# Patient Record
Sex: Male | Born: 1985 | Race: White | Hispanic: No | Marital: Single | Smoking: Current every day smoker
Health system: Southern US, Community
[De-identification: ages and names within clinical notes are randomized; demographics above are authoritative.]

## PROBLEM LIST (undated history)

## (undated) DIAGNOSIS — K219 Gastro-esophageal reflux disease without esophagitis: Secondary | ICD-10-CM

## (undated) DIAGNOSIS — N201 Calculus of ureter: Secondary | ICD-10-CM

## (undated) DIAGNOSIS — R3915 Urgency of urination: Secondary | ICD-10-CM

## (undated) DIAGNOSIS — Z87442 Personal history of urinary calculi: Secondary | ICD-10-CM

## (undated) DIAGNOSIS — D759 Disease of blood and blood-forming organs, unspecified: Secondary | ICD-10-CM

## (undated) DIAGNOSIS — D693 Immune thrombocytopenic purpura: Secondary | ICD-10-CM

## (undated) DIAGNOSIS — K589 Irritable bowel syndrome without diarrhea: Secondary | ICD-10-CM

---
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
## 2007-04-10 ENCOUNTER — Encounter: Admission: RE | Admit: 2007-04-10 | Discharge: 2007-04-10 | Payer: Self-pay | Admitting: Family Medicine

## 2007-05-31 ENCOUNTER — Ambulatory Visit: Payer: Self-pay | Admitting: Gastroenterology

## 2007-05-31 LAB — CONVERTED CEMR LAB
ALT: 29 units/L (ref 0–53)
AST: 19 units/L (ref 0–37)
Albumin: 4 g/dL (ref 3.5–5.2)
Alkaline Phosphatase: 76 units/L (ref 39–117)
Basophils Absolute: 0.1 10*3/uL (ref 0.0–0.1)
Eosinophils Absolute: 0.1 10*3/uL (ref 0.0–0.6)
GFR calc Af Amer: 111 mL/min
Glucose, Bld: 92 mg/dL (ref 70–99)
HCT: 48.7 % (ref 39.0–52.0)
Hemoglobin, Urine: NEGATIVE
Ketones, ur: NEGATIVE mg/dL
MCHC: 33.4 g/dL (ref 30.0–36.0)
MCV: 84.2 fL (ref 78.0–100.0)
Nitrite: NEGATIVE
Platelets: 133 10*3/uL — ABNORMAL LOW (ref 150–400)
Potassium: 4.4 meq/L (ref 3.5–5.1)
RBC: 5.78 M/uL (ref 4.22–5.81)
RDW: 11.8 % (ref 11.5–14.6)
Sodium: 140 meq/L (ref 135–145)
Total Bilirubin: 0.8 mg/dL (ref 0.3–1.2)
Total Protein: 6.8 g/dL (ref 6.0–8.3)
Urine Glucose: NEGATIVE mg/dL
Urobilinogen, UA: 0.2 (ref 0.0–1.0)
WBC: 7.5 10*3/uL (ref 4.5–10.5)
pH: 6.5 (ref 5.0–8.0)

## 2007-06-03 ENCOUNTER — Ambulatory Visit: Payer: Self-pay | Admitting: Internal Medicine

## 2007-06-05 ENCOUNTER — Ambulatory Visit (HOSPITAL_COMMUNITY): Admission: RE | Admit: 2007-06-05 | Discharge: 2007-06-05 | Payer: Self-pay | Admitting: Gastroenterology

## 2007-06-05 ENCOUNTER — Ambulatory Visit: Payer: Self-pay | Admitting: Gastroenterology

## 2007-06-10 ENCOUNTER — Ambulatory Visit: Payer: Self-pay | Admitting: Gastroenterology

## 2009-03-22 ENCOUNTER — Encounter: Admission: RE | Admit: 2009-03-22 | Discharge: 2009-03-22 | Payer: Self-pay | Admitting: Family Medicine

## 2010-09-13 NOTE — Assessment & Plan Note (Signed)
[facility]                         GASTROENTEROLOGY OFFICE NOTE   NAME:[name], Blake Powell                    MRN:          [id number]  DATE:06/05/2007                            DOB:          April 08, 1986    PROBLEM:  Abdominal pain.   [name] has returned for re-evaluation.  He continues to complain  of migratory abdominal pain.  It is rather diffuse, but mostly described  as burning in his midepigastrium.  He has had postprandial vomiting.  Symptoms are not improved since starting his PrevPak.  He has had some  loose stools.   A CT of the abdomen and pelvis was unremarkable.  Laboratory was also  unremarkable.  He is without fevers or myalgias.   ON EXAM:  Blood pressure 118/96, weight 230.  On abdominal exam, he has minimal tenderness in the right upper  quadrant, without guarding or rebound.  There are no abdominal masses or  organomegaly.   IMPRESSION:  Persistent burning abdominal pain, now centered in the  upper abdomen, with postprandial vomiting.  Symptoms could be due to  active peptic disease.  It is unlikely that he has appendicitis.  There  is no evidence by CT of any other acute process in the abdomen or  pelvis.  A prolonged gastritis or gastroenteritis is less likely.   RECOMMENDATIONS:  1. Continue Prevacid.  2. Upper endoscopy.     Barbette Hair. Arlyce Dice, MD,FACG  Electronically Signed    RDK/MedQ  DD: 06/05/2007  DT: 06/05/2007  Job #: [id number]

## 2010-09-16 NOTE — Consult Note (Signed)
NAME:  Blake Powell, Blake Powell NO.:  [id number]   MEDICAL RECORD NO.:  [id number]          PATIENT TYPE:  EMS   LOCATION:  [facility]                         FACILITY:  [facility]   PHYSICIAN:  Michael L. Reynolds, M.D.DATE OF BIRTH:  01/28/1986   DATE OF CONSULTATION:  03/12/2005  DATE OF DISCHARGE:  03/12/2005                                   CONSULTATION   REQUESTING PHYSICIAN:  [facility]   CHIEF COMPLAINT:  Headache and right-sided weakness.   HISTORY OF PRESENT ILLNESS:  This is the initial emergency room consultation  evaluation of this 25 year old man, past medical history which includes  irritable bowel syndrome.  Patient reports that he went to bed this  yesterday evening feeling well.  He woke about 12:30 or 1:00 this morning  with a severe headache.  He describes this as severe in character in the  middle of his head with throbbing component associated with some  photophobia, but not really with nausea or phonophobia.  He was brought to  the emergency room for further evaluation of this.  He says that he had a  brief episode while he was in the emergency room of not being able to see  out of his right eye which quickly resolved.  He also has been noted to have  some right-sided weakness in the emergency room as well.  His headache has  been fluctuating somewhat.  He denies having any associated numbness which  he is aware, although his right side feels as if it is tired.  He does, on  further questioning, have a history of headaches dating back to 52 or 25  years of age which he says were fairly severe, although not enough to  interfere with his schooling, often lasting all day which he would sometimes  take medications.  He has had laboratory work and CT in the emergency room  which are available for my review.  According to the ER physician this  evening he would hit his head due to the pain.   PAST MEDICAL HISTORY:  Remarkable for headaches as above  and irritable bowel  syndrome as above.  He denies any other medical problems.   FAMILY HISTORY:  Remarkable for migraine in his mother.   SOCIAL HISTORY:  No known alcohol or drug use.   MEDICATIONS:  None.   REVIEW OF SYSTEMS:  Remarkable for the right-sided weakness, photophobia as  above.  He has not had any fever, chills, nausea, vomiting, chest pain,  shortness of breath.  Full 10-system review is negative except as otherwise  outlined in the HPI and in the emergency room records.   PHYSICAL EXAMINATION:  VITAL SIGNS:  Temperature 97.2, blood pressure  118/70, pulse 74, respirations 24.  GENERAL:  This is a healthy-appearing man supine in the hospital bed in no  evident distress, a little uncomfortable.  HEENT:  Head:  Cranium is normocephalic, atraumatic.  Oropharynx is benign.  NECK:  Supple without carotid or supraclavicular bruits.  HEART:  Regular rate and rhythm without murmurs.  NEUROLOGIC:  Mental status:  He is awake, alert.  He is oriented to person,  place, and time.  Recent and remote memory are adequate.  He is able to name  objects and repeat phrases.  His speech is fluent and not dysarthric.  Cranial nerves:  Funduscopic examination is benign.  Pupils are equal and  briskly reactive.  Extraocular movements full without nystagmus.  Visual  fields full to confrontation.  He reports not seeing well off to the right  side, but has a definite threat to visual stimuli from the right side.  Hearing is intact to conversational speech.  He reports diminished pin prick  sensation on the right side.  He also reports diminished vibratory sensation  on the right side of the forehead greater than left with forehead splitting.  Face, tongue, and palate move normally and symmetrically.  Shoulder shrug  strength is normal.  Motor testing:  Normal bulk and tone.  He has giveaway  weakness on the right upper and lower extremities.  Left is normal strength.  Sensation:  He reports  diminished pin prick sensation over the entire right  upper and lower extremities as well as the adjacent trunk.  Coordination:  Rapid movements are performed well.  He misses his nose on finger to nose  testing on the right with his eyes closed, but does well with cerebellar  tasks including finger to nose and heel to shin with his eyes open.  Gait:  He arises from a chair easily.  Stance is normal.  He is able to heel and  toe walk without much difficulty, performs Romberg maneuver without falling.  Reflexes 2+ and symmetric.  Toes are downgoing.  He had idiopathic  thrombocytopenic purpura at age 40 and continues to be checked on occasion  for that.   LABORATORIES:  CBC:  White count 6, hemoglobin 14.3, platelets 120,000.  Coags are normal.  Urinalysis is negative.  Urine drug screen is pending.  Alcohol level is negative.  Chemistries are unremarkable.  CT of the head is  personally reviewed and the study is normal.   IMPRESSION:  1.  Headache.  With a previous history of the photophobia and the nighttime      onset as well as the positive family history this most likely represents      migraine.  2.  Right-sided focal neurologic symptoms.  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   RECOMMENDATIONS:  Will check an MRI of the brain with MRA of the  intracranial circulation just to demonstrate the absence of an acute  infarct, MS plaque, or other acute process.  If it is negative as expected,  I would recommend empiric treatment for migraine.  Thank you for this  consultation.      Michael L. Thad Ranger, M.D.  Electronically Signed     MLR/MEDQ  D:  03/12/2005  T:  03/13/2005  Job:  [id number]

## 2010-09-16 NOTE — Assessment & Plan Note (Signed)
[facility]                         GASTROENTEROLOGY OFFICE NOTE   NAME:Blake Powell, Blake Powell                    MRN:          [id number]  DATE:05/31/2007                            DOB:          24-Oct-1985    PROBLEM:  Abdominal pain x3 days with nausea, vomiting, and small volume  hematemesis.   HISTORY OF PRESENT ILLNESS:  Blake Powell is a pleasant generally healthy 25-  year-old white male with no known chronic medical problems and no  previous surgeries.  He has had problems with headaches.  The patient  says that his symptoms started about three days ago with epigastric pain  which has been fairly constant since, burning in nature, but has now  moved down into his low back and right lower quadrant and into the right  leg.  He has not been aware of any injury.  This was associated  initially with nausea and vomiting several times the first day that he  was ill.  He says he vomited up some streaks of blood with his emesis  but no gross blood or coffee ground material.  He has not had any fever  or chills.  No diarrhea, melena or hematochezia.  He was seen by [place]  [facility].  Was tested for H. pylori.  This was  positive.  Also had a CBC done showing WBC 7.6, hemoglobin 15.6,  hematocrit 45.1, MCV 80, platelets 136.  He was placed on a Prevpac.  He  says he does not feel quite as bad, but continues to have pain.  Has not  vomited today.  He denies any dysuria or hematuria, but says that he has  been urinating a lot.   CURRENT MEDICATIONS:  Prevpac.   ALLERGIES:  SULFA.   PAST MEDICAL HISTORY:  Benign with the exception of unspecified  headaches.  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   FAMILY HISTORY:  Pertinent for diabetes on the father's side of the  family, Crohn's disease on the mother's side of the family, but not in  his immediate family.   SOCIAL HISTORY:  The patient is single.  He lives with his parents.  He  has GED.  He  is employed with [employer].  He is a smoker one pack  per day.  Denies any regular ETOH.   REVIEW OF SYSTEMS:  GI:  As outlined above.  He also complains of  fatigue.  Review of Systems is, otherwise, negative.   PHYSICAL EXAMINATION:  GENERAL:  A well-developed young white male in no  acute distress.  He is pleasant.  VITAL SIGNS:  Height 6 feet 4 inches, weight 231, blood pressure 129/72,  pulse in the 60's.  HEENT:  Atraumatic, normocephalic.  EOMI, PERRLA.  Sclerae anicteric.  NECK:  Supple without nodes.  CARDIOVASCULAR:  Regular rate and rhythm with S1 and S2.  No murmur, rub  or gallop.  PULMONARY:  Clear to A&P.  ABDOMEN:  Soft.  He is tender in the epigastrium and in the right mid  quadrant.  There is no guarding or rebound.  No mass or  hepatosplenomegaly.  Bowel sounds are active.  RECTAL:  Not done today.   IMPRESSION:  1. A 25 year old white male with three day history of epigastric pain,      nausea, vomiting, and minimal streaky hematemesis now with pain      somewhat migratory to lower abdomen.  Etiology is not entirely      clear.  Rule out gastritis, peptic ulcer disease, gastroenteritis,      ureterolithiasis, doubt appendicitis as he clinically does not      appear ill or toxic.  2. Small volume hematemesis probably related to retching.  3. Chronic gastroesophageal reflux disease.   PLAN:  1. Check CT scan of the abdomen and pelvis.  2. Repeat CBC with differential.  Check CMET and UA.  3. Advise patient to continue the Prevpac for the time being, though      seriously doubt that H. pylori was responsible for his illness the      PPI will help him.  4. Tylenol on a p.r.n. basis.  He had been taking some ibuprofen, and      was advised to switch to Tylenol as needed.  We will see him back      in the office in a week or two or sooner pending results of his      labs and CT.  He may need endoscopy, but will await CT.      Mike Gip, PA-C   Electronically Signed      Barbette Hair. Arlyce Dice, MD,FACG  Electronically Signed   AE/MedQ  DD: 05/31/2007  DT: 06/01/2007  Job #: [id number]   cc:   Ernestina Penna, M.D.  [facility]

## 2011-08-02 ENCOUNTER — Emergency Department (HOSPITAL_BASED_OUTPATIENT_CLINIC_OR_DEPARTMENT_OTHER)
Admission: EM | Admit: 2011-08-02 | Discharge: 2011-08-02 | Disposition: A | Discharge status: Home / Self Care | Payer: Worker's Compensation | Attending: Emergency Medicine | Admitting: Emergency Medicine

## 2011-08-02 ENCOUNTER — Emergency Department (INDEPENDENT_AMBULATORY_CARE_PROVIDER_SITE_OTHER): Payer: Worker's Compensation

## 2011-08-02 ENCOUNTER — Encounter (HOSPITAL_BASED_OUTPATIENT_CLINIC_OR_DEPARTMENT_OTHER): Payer: Self-pay | Admitting: *Deleted

## 2011-08-02 DIAGNOSIS — Y9289 Other specified places as the place of occurrence of the external cause: Secondary | ICD-10-CM | POA: Insufficient documentation

## 2011-08-02 DIAGNOSIS — W312XXA Contact with powered woodworking and forming machines, initial encounter: Secondary | ICD-10-CM

## 2011-08-02 DIAGNOSIS — W298XXA Contact with other powered powered hand tools and household machinery, initial encounter: Secondary | ICD-10-CM | POA: Insufficient documentation

## 2011-08-02 DIAGNOSIS — S6010XA Contusion of unspecified finger with damage to nail, initial encounter: Secondary | ICD-10-CM

## 2011-08-02 DIAGNOSIS — S62639B Displaced fracture of distal phalanx of unspecified finger, initial encounter for open fracture: Secondary | ICD-10-CM | POA: Insufficient documentation

## 2011-08-02 DIAGNOSIS — S61209A Unspecified open wound of unspecified finger without damage to nail, initial encounter: Secondary | ICD-10-CM

## 2011-08-02 DIAGNOSIS — S61218A Laceration without foreign body of other finger without damage to nail, initial encounter: Secondary | ICD-10-CM

## 2011-08-02 DIAGNOSIS — S6000XA Contusion of unspecified finger without damage to nail, initial encounter: Secondary | ICD-10-CM | POA: Insufficient documentation

## 2011-08-02 DIAGNOSIS — IMO0002 Reserved for concepts with insufficient information to code with codable children: Secondary | ICD-10-CM

## 2011-08-02 DIAGNOSIS — S62639A Displaced fracture of distal phalanx of unspecified finger, initial encounter for closed fracture: Secondary | ICD-10-CM

## 2011-08-02 MED ORDER — CEPHALEXIN 500 MG PO CAPS: 500.0000 mg | ORAL_CAPSULE | Freq: Three times a day (TID) | ORAL | Status: AC

## 2011-08-02 MED ORDER — LIDOCAINE HCL (PF) 1 % IJ SOLN
5.0000 mL | Freq: Once | INTRAMUSCULAR | Status: AC
  Administered 2011-08-02: 5 mL

## 2011-08-02 MED ORDER — LIDOCAINE HCL (PF) 1 % IJ SOLN
INTRAMUSCULAR | Status: AC
  Administered 2011-08-02: 5 mL
  Filled 2011-08-02: qty 5

## 2011-08-02 MED ORDER — CEPHALEXIN 250 MG PO CAPS
500.0000 mg | ORAL_CAPSULE | Freq: Once | ORAL | Status: AC
  Administered 2011-08-02: 500 mg via ORAL
  Filled 2011-08-02: qty 2

## 2011-08-02 MED ORDER — HYDROCODONE-ACETAMINOPHEN 5-325 MG PO TABS: 2.0000 | ORAL_TABLET | Freq: Four times a day (QID) | ORAL | Status: AC | PRN

## 2011-08-02 MED ORDER — HYDROCODONE-ACETAMINOPHEN 5-325 MG PO TABS
2.0000 | ORAL_TABLET | Freq: Once | ORAL | Status: AC
  Administered 2011-08-02: 2 via ORAL
  Filled 2011-08-02: qty 2

## 2011-08-02 NOTE — Discharge Instructions (Signed)
Elevate your hand. Take the antibiotic until gone. Take the Norco for pain. Call Dr. Ophelia Charter office in the morning to get an appointment to see him this week. You have a open tuft fracture with laceration of your left middle finger. Wear the finger splint for support. You can change the dressing daily and use generic triple antibiotic ointment on the wound. Recheck sooner if you get worsening pain. It starts draining pus, you see increased swelling and redness of your finger extending into your hand.

## 2011-08-02 NOTE — ED Notes (Signed)
Pt. Reports he cut his L middle finger with an electric saw at work tonight.  Pt. Reports pain 7/10.  Controlled bleeding.  Pt. Supervisor at his side states the Pt. 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

## 2011-08-02 NOTE — ED Notes (Signed)
Dr Lynelle Doctor at bedside to suture patient.

## 2011-08-02 NOTE — ED Notes (Signed)
Pt reports that he was cutting scrap wood with electric saw and incidentally cut tip of finger with saw, laceration to middle left finger, 3/4 around finger, cms to distal tip, currently soaking in betadine solution, awaiting xray

## 2011-08-02 NOTE — ED Provider Notes (Signed)
History     CSN: [id number]  Arrival date & time 08/02/11  0017   First MD Initiated Contact with Patient 08/02/11 0101      Chief Complaint  Patient presents with  . Finger Injury    Left middle finger laceration on a saw blade.  Pt. has controlled bleeding with pain in the L middle finger.  This was a work injury.    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 He relates he was cutting wood scraps and he accidentally lacerated his left middle finger on a saw blade. He denies any numbness. He denies any other injury.  PCP none  History reviewed. No pertinent past medical history.  History reviewed. No pertinent past surgical history.  No family history on file.  History  Substance Use Topics  . Smoking status: Yes  . Smokeless tobacco: Not on file  . Alcohol Use: No   employed  Last tetanus less than 5 years ago  Review of Systems  All other systems reviewed and are negative.    Allergies  Sulfa antibiotics  Home Medications  none  BP 128/89  Pulse 77  Temp(Src) 98.3 F (36.8 C) (Oral)  Resp 18  Ht 6\' 2"  (1.88 m)  Wt 260 lb (117.935 kg)  BMI 33.38 kg/m2  SpO2 100%  Vital signs normal    Physical Exam  Constitutional: He is oriented to person, place, and time. He appears well-developed and well-nourished.  Non-toxic appearance. He does not appear ill. No distress.  HENT:  Head: Normocephalic and atraumatic.  Right Ear: External ear normal.  Left Ear: External ear normal.  Nose: Nose normal. No mucosal edema or rhinorrhea.  Mouth/Throat: Mucous membranes are normal. No dental abscesses or uvula swelling.  Eyes: Conjunctivae and EOM are normal. Pupils are equal, round, and reactive to light.  Neck: Normal range of motion and full passive range of motion without pain. Neck supple.  Pulmonary/Chest: Effort normal. No  respiratory distress. He has no rhonchi. He exhibits no crepitus.  Abdominal: Normal appearance.  Musculoskeletal: Normal range of motion. He exhibits tenderness. He exhibits no edema.       Moves all extremities well.  Patient has a laceration of his distal left middle finger that starts on the radial aspect and extends distally around to the tip of the finger and then swings proximally to involve the distal fingernail. He has a small subungual hematoma. On initial appearance there is a possibility of bony injury x-rays were done.  Neurological: He is alert and oriented to person, place, and time. He has normal strength. No cranial nerve deficit.  Skin: Skin is warm, dry and intact. No rash noted. No erythema. No pallor.  Psychiatric: He has a normal mood and affect. His speech is normal and behavior is normal. His mood appears not anxious.    ED Course  Procedures (including critical care time)   Medications  lidocaine (XYLOCAINE) 1 % injection 5 mL (5 mL Infiltration Given 08/02/11 0153)  lidocaine (XYLOCAINE) 1 % injection 5 mL (5 mL Infiltration Given 08/02/11 0216)  cephALEXin (KEFLEX) capsule 500 mg (500 mg Oral Given 08/02/11 0244)  HYDROcodone-acetaminophen (NORCO) 5-325 MG per tablet 2 tablet (2 tablet Oral Given 08/02/11 0244)     LACERATION REPAIR Performed by: Devoria Albe L Authorized by: Ward Givens Consent: Verbal consent obtained. Risks and benefits: risks, benefits and alternatives were discussed Consent given by: patient Patient identity  confirmed: provided demographic data Prepped and Draped in normal sterile fashion Wound explored  Laceration Location:distal LMF Laceration Length: 4cm  No Foreign Bodies seen or palpated  Anesthesia: local infiltration after digital block  Local anesthetic: lidocaine 1% 4cc, digital block 4 cc Anesthetic total: 8 ml  Irrigation method: local cleansing with betadyne diluted in sterile saline Amount of cleaning: standard  Skin  closure: 4-0 nylon x 8 sutures, 4-0 viacryl placed across the laceration of the distal nail Number of sutures: 9  Technique: simple interrupted, one corner suture  2 trephination holes black in the prox nail b/o subungal hematoma  Patient tolerance: Patient tolerated the procedure well with no immediate complications.  Wound dressed and finger splint applied.   Labs Reviewed - No data to display Dg Finger Middle Left  08/02/2011  *RADIOLOGY REPORT*  Clinical Data: Laceration.  LEFT MIDDLE FINGER 2+V  Comparison: None  Findings: There is a distal tuft fracture along the radial margin. The joint spaces are maintained.  IMPRESSION: Slightly displaced distal tuft fracture.  Original Report Authenticated By: P. Loralie Champagne, M.D.     1. Laceration of finger, middle   2. Open fracture of distal phalangeal tuft    New Prescriptions   CEPHALEXIN (KEFLEX) 500 MG CAPSULE    Take 1 capsule (500 mg total) by mouth 3 (three) times daily.   HYDROCODONE-ACETAMINOPHEN (NORCO) 5-325 MG PER TABLET    Take 2 tablets by mouth every 6 (six) hours as needed for pain.   Plan discharge Devoria Albe, MD, FACEP    MDM          Ward Givens, MD 08/02/11 [phone]

## 2011-08-02 NOTE — ED Notes (Signed)
Pt ambulated to xr

## 2014-11-23 ENCOUNTER — Emergency Department (HOSPITAL_COMMUNITY): Payer: Self-pay

## 2014-11-23 ENCOUNTER — Encounter (HOSPITAL_COMMUNITY): Payer: Self-pay | Admitting: Emergency Medicine

## 2014-11-23 ENCOUNTER — Emergency Department (HOSPITAL_COMMUNITY)
Admission: EM | Admit: 2014-11-23 | Discharge: 2014-11-23 | Disposition: A | Discharge status: Home / Self Care | Payer: Self-pay | Attending: Emergency Medicine | Admitting: Emergency Medicine

## 2014-11-23 DIAGNOSIS — N201 Calculus of ureter: Secondary | ICD-10-CM | POA: Insufficient documentation

## 2014-11-23 HISTORY — DX: Irritable bowel syndrome, unspecified: K58.9

## 2014-11-23 HISTORY — DX: Immune thrombocytopenic purpura: D69.3

## 2014-11-23 LAB — CBC
HCT: 45.8 % (ref 39.0–52.0)
HEMOGLOBIN: 16.4 g/dL (ref 13.0–17.0)
MCH: 29.3 pg (ref 26.0–34.0)
MCHC: 35.8 g/dL (ref 30.0–36.0)
MCV: 81.8 fL (ref 78.0–100.0)
PLATELETS: 138 10*3/uL — AB (ref 150–400)
RBC: 5.6 MIL/uL (ref 4.22–5.81)
RDW: 12.9 % (ref 11.5–15.5)
WBC: 7.7 10*3/uL (ref 4.0–10.5)

## 2014-11-23 LAB — BASIC METABOLIC PANEL
ANION GAP: 8 (ref 5–15)
BUN: 13 mg/dL (ref 6–20)
CALCIUM: 9.6 mg/dL (ref 8.9–10.3)
CHLORIDE: 107 mmol/L (ref 101–111)
CO2: 25 mmol/L (ref 22–32)
Creatinine, Ser: 1.18 mg/dL (ref 0.61–1.24)
GFR calc Af Amer: >60 mL/min (ref >60)
GFR calc non Af Amer: >60 mL/min (ref >60)
Glucose, Bld: 94 mg/dL (ref 65–99)
POTASSIUM: 3.7 mmol/L (ref 3.5–5.1)
Sodium: 140 mmol/L (ref 135–145)

## 2014-11-23 LAB — URINALYSIS, ROUTINE W REFLEX MICROSCOPIC
Glucose, UA: NEGATIVE mg/dL
Ketones, ur: 15 mg/dL — AB
Nitrite: POSITIVE — AB
PH: 6 (ref 5.0–8.0)
SPECIFIC GRAVITY, URINE: 1.029 (ref 1.005–1.030)
UROBILINOGEN UA: 1 mg/dL (ref 0.0–1.0)

## 2014-11-23 LAB — URINE MICROSCOPIC-ADD ON

## 2014-11-23 MED ORDER — KETOROLAC TROMETHAMINE 30 MG/ML IJ SOLN
30.0000 mg | Freq: Once | INTRAMUSCULAR | Status: AC
  Administered 2014-11-23: 30 mg via INTRAVENOUS
  Filled 2014-11-23: qty 1

## 2014-11-23 MED ORDER — DEXTROSE 5 % IV SOLN
1.0000 g | Freq: Once | INTRAVENOUS | Status: AC
  Administered 2014-11-23: 1 g via INTRAVENOUS
  Filled 2014-11-23: qty 10

## 2014-11-23 MED ORDER — OXYCODONE-ACETAMINOPHEN 5-325 MG PO TABS
1.0000 | ORAL_TABLET | Freq: Once | ORAL | Status: AC
  Administered 2014-11-23: 1 via ORAL
  Filled 2014-11-23: qty 1

## 2014-11-23 MED ORDER — ONDANSETRON 4 MG PO TBDP: 4.0000 mg | ORAL_TABLET | Freq: Three times a day (TID) | ORAL | Status: AC | PRN

## 2014-11-23 MED ORDER — ONDANSETRON HCL 4 MG/2ML IJ SOLN
4.0000 mg | Freq: Once | INTRAMUSCULAR | Status: AC
  Administered 2014-11-23: 4 mg via INTRAVENOUS
  Filled 2014-11-23: qty 2

## 2014-11-23 MED ORDER — OXYCODONE-ACETAMINOPHEN 5-325 MG PO TABS: 1.0000 | ORAL_TABLET | ORAL | Status: DC | PRN

## 2014-11-23 NOTE — ED Provider Notes (Signed)
CSN: [id number]     Arrival date & time 11/23/14  1559 History   First MD Initiated Contact with Patient 11/23/14 1949     Chief Complaint  Patient presents with  . Hematuria    The patient said he has been having left abdominal pain for the last week.  He said he started having blood in his urine and it has gotten worse.       (Consider location/radiation/quality/duration/timing/severity/associated sxs/prior Treatment) HPI Comments: Patient is a 29 yo M presenting to the ED for evaluation of hematuria. Patient states he has had intermittent hematuria for the last month, but over the last three days it has been constant. He is also endorsing LLQ pain now. His pain is an 8/10. No modifying factors. No medications tried PTA. Patient endorses history of kidney stones, denies ever seeing a urologist or requiring stent placement and lithotripsy. He does endorse associated dysuria and nausea without vomiting. Denies any fevers.  Patient is a 29 y.o. male presenting with hematuria. The history is provided by the patient.  Hematuria This is a recurrent problem. The current episode started 1 to 4 weeks ago. The problem occurs constantly. The problem has been gradually worsening. Associated symptoms include abdominal pain, nausea and urinary symptoms. Pertinent negatives include no fever or vomiting. Nothing aggravates the symptoms. He has tried nothing for the symptoms. The treatment provided no relief.    Past Medical History  Diagnosis Date  . ITP (idiopathic thrombocytopenic purpura)   . IBS (irritable bowel syndrome)    No past surgical history on file. No family history on file. History  Substance Use Topics  . Smoking status: Not on file  . Smokeless tobacco: Not on file  . Alcohol Use: Not on file    Review of Systems  Constitutional: Negative for fever.  Gastrointestinal: Positive for nausea and abdominal pain. Negative for vomiting.  Genitourinary: Positive for hematuria.  All  other systems reviewed and are negative.     Allergies  Sulfa antibiotics  Home Medications   Prior to Admission medications   Medication Sig Start Date End Date Taking? Authorizing Provider  ondansetron (ZOFRAN ODT) 4 MG disintegrating tablet Take 1 tablet (4 mg total) by mouth every 8 (eight) hours as needed for nausea or vomiting. 11/23/14   Francee Piccolo, PA-C  oxyCODONE-acetaminophen (PERCOCET/ROXICET) 5-325 MG per tablet Take 1 tablet by mouth every 4 (four) hours as needed for severe pain. May take 2 tablets PO q 6 hours for severe pain - Do not take with Tylenol as this tablet already contains tylenol 11/23/14   Victorino Dike Mariusz Jubb, PA-C   BP 114/87 mmHg  Pulse 62  Temp(Src) 98.6 F (37 C) (Oral)  Resp 18  SpO2 100% Physical Exam  Constitutional: He is oriented to person, place, and time. He appears well-developed and well-nourished. No distress.  HENT:  Head: Normocephalic and atraumatic.  Right Ear: External ear normal.  Left Ear: External ear normal.  Nose: Nose normal.  Mouth/Throat: Oropharynx is clear and moist.  Eyes: Conjunctivae are normal.  Neck: Normal range of motion. Neck supple.  No nuchal rigidity.   Cardiovascular: Normal rate, regular rhythm and normal heart sounds.   Pulmonary/Chest: Effort normal and breath sounds normal. No respiratory distress.  Abdominal: Soft. There is no tenderness.  Musculoskeletal: Normal range of motion.  Neurological: He is alert and oriented to person, place, and time.  Skin: Skin is warm and dry. He is not diaphoretic.  Psychiatric: He has a normal mood  and affect.  Nursing note and vitals reviewed.   ED Course  Procedures (including critical care time) Medications  ondansetron (ZOFRAN) injection 4 mg (4 mg Intravenous Given 11/23/14 2032)  ketorolac (TORADOL) 30 MG/ML injection 30 mg (30 mg Intravenous Given 11/23/14 2036)  cefTRIAXone (ROCEPHIN) 1 g in dextrose 5 % 50 mL IVPB (0 g Intravenous Stopped 11/23/14  2320)  oxyCODONE-acetaminophen (PERCOCET/ROXICET) 5-325 MG per tablet 1 tablet (1 tablet Oral Given 11/23/14 2331)    Labs Review Labs Reviewed  URINALYSIS, ROUTINE W REFLEX MICROSCOPIC (NOT AT [facility]) - Abnormal; Notable for the following:    Color, Urine RED (*)    APPearance TURBID (*)    Hgb urine dipstick LARGE (*)    Bilirubin Urine MODERATE (*)    Ketones, ur 15 (*)    Protein, ur >300 (*)    Nitrite POSITIVE (*)    Leukocytes, UA SMALL (*)    All other components within normal limits  CBC - Abnormal; Notable for the following:    Platelets 138 (*)    All other components within normal limits  URINE CULTURE  BASIC METABOLIC PANEL  URINE MICROSCOPIC-ADD ON    Imaging Review Ct Renal Stone Study  11/23/2014   CLINICAL DATA:  Initial evaluation for acute left lower quadrant pain, hematuria.  EXAM: CT ABDOMEN AND PELVIS WITHOUT CONTRAST  TECHNIQUE: Multidetector CT imaging of the abdomen and pelvis was performed following the standard protocol without IV contrast.  COMPARISON:  Prior CT from 06/03/2007  FINDINGS: Visualized lung bases are clear. Liver demonstrates a normal unenhanced appearance. Gallbladder within normal limits. No biliary dilatation. Spleen, adrenal glands, and pancreas demonstrate a normal unenhanced appearance.  Nonobstructive 5 mm stone present within the interpolar right kidney. No radiopaque calculi seen along the course of the right renal collecting system. There is no right-sided hydronephrosis or hydroureter.  On the left, there is a in 9 mm stone lodged within the left renal pelvis just proximal to the left UPJ (series 5, image 67). The left renal pelvis is dilated with mild wall thickening and fuzzy margins, suggesting associated inflammation. Mild left renal caliectasis without frank hydronephrosis. Additional punctate nonobstructive stones present within the upper and lower pole left kidney. No other radiopaque calculi seen distally within the left ureter which  is of normal caliber.  Stomach within normal limits. No evidence for bowel obstruction. No acute inflammatory changes seen about the bowels. Appendix is well visualized in the right lower quadrant and is of normal caliber and appearance without associated inflammatory changes to suggest acute appendicitis.  Bladder within normal limits.  Prostate normal.  Bilateral fat containing inguinal hernias noted.  No free air or fluid. No pathologically enlarged intra-abdominal pelvic lymph nodes.  No acute osseous abnormality. No worrisome lytic or blastic osseous lesions.  IMPRESSION: 1. 9 mm stone within the distal left renal pelvis, just proximal to the left UPJ. There is mild left pelvocaliectasis without frank hydronephrosis. Mild wall thickening and haziness about the dilated pelvis/UPJ suggestive of associated inflammation. 2. Additional bilateral nonobstructive nephrolithiasis as above. 3. No other acute intra-abdominal or pelvic process.   Electronically Signed   By: Rise Mu M.D.   On: 11/23/2014 21:48     EKG Interpretation None      10:29 PM Discussed with [facility] who will see patient.   11:15 PM Dr. Mena Goes does not want to repeat UA, will see patient in office this week. Pain medication and flomax, does not recommend Abx.  Will hold on flomax d/t allergy contraindication.    MDM   Final diagnoses:  Left ureteral stone    Filed Vitals:   11/23/14 2315  BP: 114/87  Pulse: 62  Temp:   Resp:    I have reviewed nursing notes, vital signs, and all lab and all imaging results as noted above.  Pt has been diagnosed with a Kidney Stone via CT. There is no evidence of significant hydronephrosis, serum creatine WNL, vitals sign stable and the pt does not have irratractable vomiting. Pt will be dc home with pain medications & has been advised to follow up with urology as discussed with fellow and Dr. Mena Goes. Patient is agreeable to the plan. Return precautions  discussed. Patient is agreeable to plan. Patient is stable at time of discharge. Patient d/w with Dr. Jeraldine Loots, agrees with plan.       Francee Piccolo, PA-C 11/24/14 0010  Gerhard Munch, MD 11/24/14 0030

## 2014-11-23 NOTE — ED Notes (Signed)
Pt is requesting more pain medication. Piepenbrink, PA notified.

## 2014-11-23 NOTE — ED Notes (Signed)
PA at bedside.

## 2014-11-23 NOTE — ED Notes (Signed)
The patient said he has been having left abdominal pain for the last week.  He said he started having blood in his urine and it has gotten worse.  He says it started with minimal blood now it is frank blood in his urine.  He rates his pain 8/10.

## 2014-11-23 NOTE — Discharge Instructions (Signed)
Please follow up with your primary care physician in 1-2 days. If you do not have one please call the [facility] and wellness Center number listed above. Please follow up with Dr. Mena Goes tomorrow to schedule a follow up appointment.  Please take pain medication and/or muscle relaxants as prescribed and as needed for pain. Please do not drive on narcotic pain medication or on muscle relaxants. Please read all discharge instructions and return precautions.   Kidney Stones Kidney stones (urolithiasis) are deposits that form inside your kidneys. The intense pain is caused by the stone moving through the urinary tract. When the stone moves, the ureter goes into spasm around the stone. The stone is usually passed in the urine.  CAUSES   A disorder that makes certain neck glands produce too much parathyroid hormone (primary hyperparathyroidism).  A buildup of uric acid crystals, similar to gout in your joints.  Narrowing (stricture) of the ureter.  A kidney obstruction present at birth (congenital obstruction).  Previous surgery on the kidney or ureters.  Numerous kidney infections. SYMPTOMS   Feeling sick to your stomach (nauseous).  Throwing up (vomiting).  Blood in the urine (hematuria).  Pain that usually spreads (radiates) to the groin.  Frequency or urgency of urination. DIAGNOSIS   Taking a history and physical exam.  Blood or urine tests.  CT scan.  Occasionally, an examination of the inside of the urinary bladder (cystoscopy) is performed. TREATMENT   Observation.  Increasing your fluid intake.  Extracorporeal shock wave lithotripsy--This is a noninvasive procedure that uses shock waves to break up kidney stones.  Surgery may be needed if you have severe pain or persistent obstruction. There are various surgical procedures. Most of the procedures are performed with the use of small instruments. Only small incisions are needed to accommodate these instruments, so  recovery time is minimized. The size, location, and chemical composition are all important variables that will determine the proper choice of action for you. Talk to your health care provider to better understand your situation so that you will minimize the risk of injury to yourself and your kidney.  HOME CARE INSTRUCTIONS   Drink enough water and fluids to keep your urine clear or pale yellow. This will help you to pass the stone or stone fragments.  Strain all urine through the provided strainer. Keep all particulate matter and stones for your health care provider to see. The stone causing the pain may be as small as a grain of salt. It is very important to use the strainer each and every time you pass your urine. The collection of your stone will allow your health care provider to analyze it and verify that a stone has actually passed. The stone analysis will often identify what you can do to reduce the incidence of recurrences.  Only take over-the-counter or prescription medicines for pain, discomfort, or fever as directed by your health care provider.  Make a follow-up appointment with your health care provider as directed.  Get follow-up X-rays if required. The absence of pain does not always mean that the stone has passed. It may have only stopped moving. If the urine remains completely obstructed, it can cause loss of kidney function or even complete destruction of the kidney. It is your responsibility to make sure X-rays and follow-ups are completed. Ultrasounds of the kidney can show blockages and the status of the kidney. Ultrasounds are not associated with any radiation and can be performed easily in a matter of minutes.  SEEK MEDICAL CARE IF:  You experience pain that is progressive and unresponsive to any pain medicine you have been prescribed. SEEK IMMEDIATE MEDICAL CARE IF:   Pain cannot be controlled with the prescribed medicine.  You have a fever or shaking chills.  The  severity or intensity of pain increases over 18 hours and is not relieved by pain medicine.  You develop a new onset of abdominal pain.  You feel faint or pass out.  You are unable to urinate. MAKE SURE YOU:   Understand these instructions.  Will watch your condition.  Will get help right away if you are not doing well or get worse. Document Released: 04/17/2005 Document Revised: 12/18/2012 Document Reviewed: 09/18/2012 [facility] Patient Information 2015 [place], [place]. This information is not intended to replace advice given to you by your health care provider. Make sure you discuss any questions you have with your health care provider.

## 2014-11-23 NOTE — ED Notes (Signed)
Urology oat bedside.

## 2014-11-25 ENCOUNTER — Other Ambulatory Visit: Payer: Self-pay | Admitting: Urology

## 2014-11-25 LAB — URINE CULTURE

## 2014-11-26 ENCOUNTER — Encounter (HOSPITAL_COMMUNITY): Payer: Self-pay | Admitting: *Deleted

## 2014-11-30 ENCOUNTER — Encounter (HOSPITAL_COMMUNITY)
Admission: RE | Disposition: A | Discharge status: Home / Self Care | Payer: Self-pay | Source: Ambulatory Visit | Attending: Urology

## 2014-11-30 ENCOUNTER — Ambulatory Visit (HOSPITAL_COMMUNITY): Payer: Self-pay

## 2014-11-30 ENCOUNTER — Ambulatory Visit (HOSPITAL_COMMUNITY)
Admission: RE | Admit: 2014-11-30 | Discharge: 2014-11-30 | Disposition: A | Discharge status: Home / Self Care | Payer: Self-pay | Source: Ambulatory Visit | Attending: Urology | Admitting: Urology

## 2014-11-30 ENCOUNTER — Encounter (HOSPITAL_COMMUNITY): Payer: Self-pay | Admitting: *Deleted

## 2014-11-30 DIAGNOSIS — Z79891 Long term (current) use of opiate analgesic: Secondary | ICD-10-CM | POA: Insufficient documentation

## 2014-11-30 DIAGNOSIS — D759 Disease of blood and blood-forming organs, unspecified: Secondary | ICD-10-CM | POA: Insufficient documentation

## 2014-11-30 DIAGNOSIS — F17210 Nicotine dependence, cigarettes, uncomplicated: Secondary | ICD-10-CM | POA: Insufficient documentation

## 2014-11-30 DIAGNOSIS — Z79899 Other long term (current) drug therapy: Secondary | ICD-10-CM | POA: Insufficient documentation

## 2014-11-30 DIAGNOSIS — K589 Irritable bowel syndrome without diarrhea: Secondary | ICD-10-CM | POA: Insufficient documentation

## 2014-11-30 DIAGNOSIS — Z87442 Personal history of urinary calculi: Secondary | ICD-10-CM | POA: Insufficient documentation

## 2014-11-30 DIAGNOSIS — K219 Gastro-esophageal reflux disease without esophagitis: Secondary | ICD-10-CM | POA: Insufficient documentation

## 2014-11-30 DIAGNOSIS — N200 Calculus of kidney: Secondary | ICD-10-CM | POA: Insufficient documentation

## 2014-11-30 HISTORY — DX: Gastro-esophageal reflux disease without esophagitis: K21.9

## 2014-11-30 HISTORY — PX: EXTRACORPOREAL SHOCK WAVE LITHOTRIPSY: SHX1557

## 2014-11-30 HISTORY — DX: Disease of blood and blood-forming organs, unspecified: D75.9

## 2014-11-30 SURGERY — LITHOTRIPSY, ESWL
Anesthesia: LOCAL | Laterality: Left

## 2014-11-30 MED ORDER — SODIUM CHLORIDE 0.9 % IV SOLN
INTRAVENOUS | Status: DC
  Administered 2014-11-30: 07:00:00 via INTRAVENOUS

## 2014-11-30 MED ORDER — DIPHENHYDRAMINE HCL 25 MG PO CAPS
25.0000 mg | ORAL_CAPSULE | ORAL | Status: AC
  Administered 2014-11-30: 25 mg via ORAL
  Filled 2014-11-30: qty 1

## 2014-11-30 MED ORDER — SENNOSIDES-DOCUSATE SODIUM 8.6-50 MG PO TABS: 1.0000 | ORAL_TABLET | Freq: Two times a day (BID) | ORAL | Status: DC

## 2014-11-30 MED ORDER — CIPROFLOXACIN HCL 500 MG PO TABS
500.0000 mg | ORAL_TABLET | ORAL | Status: AC
  Administered 2014-11-30: 500 mg via ORAL
  Filled 2014-11-30: qty 1

## 2014-11-30 MED ORDER — OXYCODONE-ACETAMINOPHEN 5-325 MG PO TABS: 1.0000 | ORAL_TABLET | ORAL | Status: AC | PRN

## 2014-11-30 MED ORDER — DIAZEPAM 5 MG PO TABS
10.0000 mg | ORAL_TABLET | ORAL | Status: AC
  Administered 2014-11-30: 10 mg via ORAL
  Filled 2014-11-30: qty 2

## 2014-11-30 NOTE — Discharge Instructions (Signed)
1 - You may have urinary urgency (bladder spasms), pass small stone fragments,  and have bloody urine on / off x few days. This is normal.  2 - Call MD or go to ER for fever >102, severe pain / nausea / vomiting not relieved by medications, or acute change in medical status   Conscious Sedation, Adult, Care After Refer to this sheet in the next few weeks. These instructions provide you with information on caring for yourself after your procedure. Your health care provider may also give you more specific instructions. Your treatment has been planned according to current medical practices, but problems sometimes occur. Call your health care provider if you have any problems or questions after your procedure. WHAT TO EXPECT AFTER THE PROCEDURE  After your procedure:  You may feel sleepy, clumsy, and have poor balance for several hours.  Vomiting may occur if you eat too soon after the procedure. HOME CARE INSTRUCTIONS  Do not participate in any activities where you could become injured for at least 24 hours. Do not:  Drive.  Swim.  Ride a bicycle.  Operate heavy machinery.  Cook.  Use power tools.  Climb ladders.  Work from a high place.  Do not make important decisions or sign legal documents until you are improved.  If you vomit, drink water, juice, or soup when you can drink without vomiting. Make sure you have little or no nausea before eating solid foods.  Only take over-the-counter or prescription medicines for pain, discomfort, or fever as directed by your health care provider.  Make sure you and your family fully understand everything about the medicines given to you, including what side effects may occur.  You should not drink alcohol, take sleeping pills, or take medicines that cause drowsiness for at least 24 hours.  If you smoke, do not smoke without supervision.  If you are feeling better, you may resume normal activities 24 hours after you were sedated.  Keep  all appointments with your health care provider. SEEK MEDICAL CARE IF:  Your skin is pale or bluish in color.  You continue to feel nauseous or vomit.  Your pain is getting worse and is not helped by medicine.  You have bleeding or swelling.  You are still sleepy or feeling clumsy after 24 hours. SEEK IMMEDIATE MEDICAL CARE IF:  You develop a rash.  You have difficulty breathing.  You develop any type of allergic problem.  You have a fever. MAKE SURE YOU:  Understand these instructions.  Will watch your condition.  Will get help right away if you are not doing well or get worse. Document Released: 02/05/2013 Document Reviewed: 02/05/2013 [facility] Patient Information 2015 [place], [place]. This information is not intended to replace advice given to you by your health care provider. Make sure you discuss any questions you have with your health care provider.

## 2014-11-30 NOTE — Brief Op Note (Signed)
11/30/2014  8:16 AM  PATIENT:  Blake Powell  29 y.o. male  PRE-OPERATIVE DIAGNOSIS:  LEFT RENAL CALCULUS  POST-OPERATIVE DIAGNOSIS:  * No post-op diagnosis entered *  PROCEDURE:  Procedure(s): LEFT EXTRACORPOREAL SHOCK WAVE LITHOTRIPSY (ESWL) (Left)  SURGEON:  Surgeon(s) and Role:    * Sebastian Ache, MD - Primary  PHYSICIAN ASSISTANT:   ASSISTANTS: none   ANESTHESIA:   MAC  EBL:     BLOOD ADMINISTERED:none  DRAINS: none   LOCAL MEDICATIONS USED:  NONE  SPECIMEN:  No Specimen  DISPOSITION OF SPECIMEN:  N/A  COUNTS:  YES  TOURNIQUET:  * No tourniquets in log *  DICTATION: .Note written in paper chart  PLAN OF CARE: Discharge to home after PACU  PATIENT DISPOSITION:  PACU - hemodynamically stable.   Delay start of Pharmacological VTE agent (>24hrs) due to surgical blood loss or risk of bleeding: not applicable

## 2014-11-30 NOTE — H&P (Signed)
Blake Powell is an 29 y.o. male.    Chief Complaint: Pre-op Left Shockwave Lithotripsy  HPI:   1 - Left UPJ / Renal Pelvis Stone - 8mm left UPJ stone with likely intermitant ball-valving on eval left flank pain 10/2014. Stone is 8mm, 740 HU, SSD 10cm . Visible L1-L2 interspace.   Today Blake Powell is seen to proceed with left shockwave lithotripsy. Pts most recently >130k.   Past Medical History  Diagnosis Date  . ITP (idiopathic thrombocytopenic purpura)   . IBS (irritable bowel syndrome)   . Renal stones     multiple in the past able to pass  . GERD (gastroesophageal reflux disease)   . Blood dyscrasia     Past Surgical History  Procedure Laterality Date  . Fracture surgery      right ankle 2006    History reviewed. No pertinent family history. Social History:  reports that he has been smoking.  He does not have any smokeless tobacco history on file. He reports that he drinks alcohol. His drug history is not on file.  Allergies:  Allergies  Allergen Reactions  . Sulfa Antibiotics Anaphylaxis    Medications Prior to Admission  Medication Sig Dispense Refill  . ondansetron (ZOFRAN ODT) 4 MG disintegrating tablet Take 1 tablet (4 mg total) by mouth every 8 (eight) hours as needed for nausea or vomiting. 20 tablet 0  . oxyCODONE-acetaminophen (PERCOCET/ROXICET) 5-325 MG per tablet Take 1 tablet by mouth every 4 (four) hours as needed for severe pain. May take 2 tablets PO q 6 hours for severe pain - Do not take with Tylenol as this tablet already contains tylenol 20 tablet 0    No results found for this or any previous visit (from the past 48 hour(s)). No results found.  Review of Systems  Constitutional: Negative.  Negative for fever and chills.  HENT: Negative.   Eyes: Negative.   Respiratory: Negative.   Cardiovascular: Negative.   Genitourinary: Positive for flank pain.  Musculoskeletal: Negative.   Skin: Negative.   Neurological: Negative.    Endo/Heme/Allergies: Negative.   Psychiatric/Behavioral: Negative.     Blood pressure 122/72, pulse 57, temperature 98.1 F (36.7 C), temperature source Oral, resp. rate 18, height  (1.905 m), weight 121.224 kg (267 lb 4 oz), SpO2 99 %. Physical Exam  Constitutional: He appears well-developed.  HENT:  Head: Normocephalic.  Eyes: Pupils are equal, round, and reactive to light.  Neck: Normal range of motion.  Cardiovascular: Normal rate.   Respiratory: Effort normal.  GI: Soft.  Genitourinary:  Mild left CVAT  Musculoskeletal: Normal range of motion.  Neurological: He is alert.  Skin: Skin is warm.  Psychiatric: He has a normal mood and affect. His behavior is normal. Judgment and thought content normal.     Assessment/Plan  1 - Left UPJ / Renal Pelvis Stone - We discussed shockwave lithotripsy in detail as well as my "rule of 9s" with stones <20mm, less than 900 HU, and skin to stone distance <9cm having approximately 90% treatment success with single session of treatment. We then addressed how stones that are larger, more dense, and in patients with less favorable anatomy have incrementally decreased success rates. We discussed risks including, bleeding, infection, hematoma, loss of kidney, need for staged therapy, need for adjunctive therapy and requirement to refrain from any anticoagulants, anti-platelet or aspirin-like products peri-procedureally.   After careful consideration, the patient has chosen to proceed today as planned.   Blake Powell 11/30/2014, 6:16 AM

## 2014-12-01 ENCOUNTER — Other Ambulatory Visit: Payer: Self-pay | Admitting: Urology

## 2014-12-01 ENCOUNTER — Ambulatory Visit (HOSPITAL_COMMUNITY): Payer: MEDICAID | Admitting: Anesthesiology

## 2014-12-01 ENCOUNTER — Encounter (HOSPITAL_COMMUNITY)
Admission: AD | Disposition: A | Discharge status: Home / Self Care | Payer: Self-pay | Source: Ambulatory Visit | Attending: Urology

## 2014-12-01 ENCOUNTER — Ambulatory Visit (HOSPITAL_COMMUNITY): Payer: Self-pay | Admitting: Anesthesiology

## 2014-12-01 ENCOUNTER — Observation Stay (HOSPITAL_COMMUNITY)
Admission: AD | Admit: 2014-12-01 | Discharge: 2014-12-02 | Disposition: A | Discharge status: Home / Self Care | Payer: Self-pay | Source: Ambulatory Visit | Attending: Urology | Admitting: Urology

## 2014-12-01 ENCOUNTER — Encounter (HOSPITAL_COMMUNITY): Payer: Self-pay | Admitting: *Deleted

## 2014-12-01 DIAGNOSIS — Z87442 Personal history of urinary calculi: Secondary | ICD-10-CM | POA: Insufficient documentation

## 2014-12-01 DIAGNOSIS — K219 Gastro-esophageal reflux disease without esophagitis: Secondary | ICD-10-CM | POA: Insufficient documentation

## 2014-12-01 DIAGNOSIS — D693 Immune thrombocytopenic purpura: Secondary | ICD-10-CM | POA: Insufficient documentation

## 2014-12-01 DIAGNOSIS — N201 Calculus of ureter: Principal | ICD-10-CM | POA: Diagnosis present

## 2014-12-01 DIAGNOSIS — F17210 Nicotine dependence, cigarettes, uncomplicated: Secondary | ICD-10-CM | POA: Insufficient documentation

## 2014-12-01 HISTORY — PX: CYSTOSCOPY/RETROGRADE/URETEROSCOPY/STONE EXTRACTION WITH BASKET: SHX5317

## 2014-12-01 LAB — CBC
HCT: 46.8 % (ref 39.0–52.0)
HEMOGLOBIN: 17.1 g/dL — AB (ref 13.0–17.0)
MCH: 29.1 pg (ref 26.0–34.0)
MCHC: 36.5 g/dL — ABNORMAL HIGH (ref 30.0–36.0)
MCV: 79.6 fL (ref 78.0–100.0)
Platelets: 124 10*3/uL — ABNORMAL LOW (ref 150–400)
RBC: 5.88 MIL/uL — AB (ref 4.22–5.81)
RDW: 12.4 % (ref 11.5–15.5)
WBC: 12.6 10*3/uL — ABNORMAL HIGH (ref 4.0–10.5)

## 2014-12-01 SURGERY — CYSTOSCOPY, WITH CALCULUS REMOVAL USING BASKET
Anesthesia: General | Site: Ureter | Laterality: Left

## 2014-12-01 MED ORDER — LIDOCAINE HCL 2 % EX GEL
CUTANEOUS | Status: AC
  Filled 2014-12-01: qty 10

## 2014-12-01 MED ORDER — FENTANYL CITRATE (PF) 100 MCG/2ML IJ SOLN
INTRAMUSCULAR | Status: DC | PRN
  Administered 2014-12-01: 100 ug via INTRAVENOUS

## 2014-12-01 MED ORDER — LIDOCAINE HCL (CARDIAC) 20 MG/ML IV SOLN
INTRAVENOUS | Status: AC
Start: 2014-12-01 — End: 2014-12-01
  Filled 2014-12-01: qty 5

## 2014-12-01 MED ORDER — DEXAMETHASONE SODIUM PHOSPHATE 10 MG/ML IJ SOLN
INTRAMUSCULAR | Status: DC | PRN
  Administered 2014-12-01: 10 mg via INTRAVENOUS

## 2014-12-01 MED ORDER — SODIUM CHLORIDE 0.9 % IV SOLN: 2.0000 mg/h | INTRAVENOUS | Status: DC

## 2014-12-01 MED ORDER — PROPOFOL 10 MG/ML IV BOLUS
INTRAVENOUS | Status: AC
  Filled 2014-12-01: qty 20

## 2014-12-01 MED ORDER — SODIUM CHLORIDE 0.9 % IR SOLN
Status: DC | PRN
  Administered 2014-12-01: 3000 mL

## 2014-12-01 MED ORDER — SODIUM CHLORIDE 0.9 % IV SOLN
INTRAVENOUS | Status: DC
  Administered 2014-12-01: 23:00:00 via INTRAVENOUS

## 2014-12-01 MED ORDER — IOHEXOL 300 MG/ML  SOLN
INTRAMUSCULAR | Status: DC | PRN
  Administered 2014-12-01: 5 mL

## 2014-12-01 MED ORDER — DEXAMETHASONE SODIUM PHOSPHATE 10 MG/ML IJ SOLN
INTRAMUSCULAR | Status: AC
  Filled 2014-12-01: qty 1

## 2014-12-01 MED ORDER — SUCCINYLCHOLINE CHLORIDE 20 MG/ML IJ SOLN
INTRAMUSCULAR | Status: DC | PRN
  Administered 2014-12-01: 140 mg via INTRAVENOUS

## 2014-12-01 MED ORDER — HYDROMORPHONE HCL 1 MG/ML IJ SOLN
2.0000 mg | INTRAMUSCULAR | Status: DC | PRN
  Administered 2014-12-01 (×2): 1 mg via INTRAVENOUS
  Administered 2014-12-01: 2 mg via INTRAVENOUS
  Filled 2014-12-01 (×2): qty 2

## 2014-12-01 MED ORDER — FENTANYL CITRATE (PF) 100 MCG/2ML IJ SOLN
INTRAMUSCULAR | Status: AC
  Filled 2014-12-01: qty 4

## 2014-12-01 MED ORDER — PROPOFOL 10 MG/ML IV BOLUS
INTRAVENOUS | Status: DC | PRN
  Administered 2014-12-01: 200 mg via INTRAVENOUS

## 2014-12-01 MED ORDER — HYDROMORPHONE HCL 1 MG/ML IJ SOLN
0.2500 mg | INTRAMUSCULAR | Status: DC | PRN
  Administered 2014-12-01 (×2): 0.5 mg via INTRAVENOUS

## 2014-12-01 MED ORDER — OXYCODONE-ACETAMINOPHEN 5-325 MG PO TABS: 1.0000 | ORAL_TABLET | Freq: Four times a day (QID) | ORAL | Status: DC | PRN

## 2014-12-01 MED ORDER — MIDAZOLAM HCL 5 MG/5ML IJ SOLN
INTRAMUSCULAR | Status: DC | PRN
  Administered 2014-12-01: 2 mg via INTRAVENOUS

## 2014-12-01 MED ORDER — BELLADONNA ALKALOIDS-OPIUM 16.2-60 MG RE SUPP
RECTAL | Status: AC
  Filled 2014-12-01: qty 1

## 2014-12-01 MED ORDER — ONDANSETRON HCL 4 MG/2ML IJ SOLN
4.0000 mg | INTRAMUSCULAR | Status: DC | PRN
  Administered 2014-12-01: 4 mg via INTRAVENOUS
  Filled 2014-12-01: qty 2

## 2014-12-01 MED ORDER — MIDAZOLAM HCL 2 MG/2ML IJ SOLN
INTRAMUSCULAR | Status: AC
  Filled 2014-12-01: qty 4

## 2014-12-01 MED ORDER — DOCUSATE SODIUM 100 MG PO CAPS: 100.0000 mg | ORAL_CAPSULE | Freq: Two times a day (BID) | ORAL | Status: DC

## 2014-12-01 MED ORDER — LIDOCAINE HCL (CARDIAC) 20 MG/ML IV SOLN
INTRAVENOUS | Status: DC | PRN
  Administered 2014-12-01: 30 mg via INTRAVENOUS

## 2014-12-01 MED ORDER — DEXTROSE 5 % IV SOLN
2.0000 g | INTRAVENOUS | Status: AC
  Administered 2014-12-01: 2 g via INTRAVENOUS
  Filled 2014-12-01: qty 2

## 2014-12-01 MED ORDER — LACTATED RINGERS IV SOLN
INTRAVENOUS | Status: DC
  Administered 2014-12-01: 17:00:00 via INTRAVENOUS

## 2014-12-01 MED ORDER — PROMETHAZINE HCL 25 MG/ML IJ SOLN: 6.2500 mg | INTRAMUSCULAR | Status: DC | PRN

## 2014-12-01 MED ORDER — 0.9 % SODIUM CHLORIDE (POUR BTL) OPTIME
TOPICAL | Status: DC | PRN
  Administered 2014-12-01: 1000 mL

## 2014-12-01 MED ORDER — HYDROMORPHONE HCL 1 MG/ML IJ SOLN
INTRAMUSCULAR | Status: AC
  Filled 2014-12-01: qty 1

## 2014-12-01 SURGICAL SUPPLY — 22 items
BAG URO CATCHER STRL LF (DRAPE) ×2 IMPLANT
BASKET LASER NITINOL 1.9FR (BASKET) IMPLANT
BSKT STON RTRVL 120 1.9FR (BASKET)
CATH INTERMIT  6FR 70CM (CATHETERS) IMPLANT
CLOTH BEACON ORANGE TIMEOUT ST (SAFETY) ×2 IMPLANT
FIBER LASER FLEXIVA 1000 (UROLOGICAL SUPPLIES) IMPLANT
FIBER LASER FLEXIVA 200 (UROLOGICAL SUPPLIES) IMPLANT
FIBER LASER FLEXIVA 365 (UROLOGICAL SUPPLIES) IMPLANT
FIBER LASER FLEXIVA 550 (UROLOGICAL SUPPLIES) IMPLANT
FIBER LASER TRAC TIP (UROLOGICAL SUPPLIES) IMPLANT
GLOVE BIOGEL M STRL SZ7.5 (GLOVE) ×2 IMPLANT
GOWN STRL REUS W/TWL LRG LVL3 (GOWN DISPOSABLE) ×4 IMPLANT
GUIDEWIRE ANG ZIPWIRE 038X150 (WIRE) IMPLANT
GUIDEWIRE STR DUAL SENSOR (WIRE) ×2 IMPLANT
IV NS 1000ML (IV SOLUTION) ×2
IV NS 1000ML BAXH (IV SOLUTION) ×1 IMPLANT
MANIFOLD NEPTUNE II (INSTRUMENTS) ×2 IMPLANT
PACK CYSTO (CUSTOM PROCEDURE TRAY) ×2 IMPLANT
STENT POLARIS 5FRX26 (STENTS) ×2 IMPLANT
SYR CONTROL 10ML LL (SYRINGE) ×2 IMPLANT
TUBE FEEDING 8FR 16IN STR KANG (MISCELLANEOUS) ×2 IMPLANT
TUBING CONNECTING 10 (TUBING) ×2 IMPLANT

## 2014-12-01 NOTE — Anesthesia Procedure Notes (Signed)
Procedure Name: Intubation Date/Time: 12/01/2014 7:27 PM Performed by: Early Osmond E Pre-anesthesia Checklist: Patient identified, Emergency Drugs available, Suction available and Patient being monitored Patient Re-evaluated:Patient Re-evaluated prior to inductionOxygen Delivery Method: Circle System Utilized Preoxygenation: Pre-oxygenation with 100% oxygen Intubation Type: IV induction, Rapid sequence and Cricoid Pressure applied Laryngoscope Size: Miller and 3 Grade View: Grade I Tube type: Oral Tube size: 7.5 mm Number of attempts: 1 Airway Equipment and Method: Stylet Placement Confirmation: ETT inserted through vocal cords under direct vision,  positive ETCO2 and breath sounds checked- equal and bilateral Secured at: 22 cm Tube secured with: Tape Dental Injury: Teeth and Oropharynx as per pre-operative assessment

## 2014-12-01 NOTE — Transfer of Care (Signed)
Immediate Anesthesia Transfer of Care Note  Patient: Blake Powell  Procedure(s) Performed: Procedure(s): CYSTOSCOPY/ WITH LEFT URETERAL STENT (Left)  Patient Location: PACU  Anesthesia Type:General  Level of Consciousness:  sedated, patient cooperative and responds to stimulation  Airway & Oxygen Therapy:Patient Spontanous Breathing and Patient connected to face mask oxgen  Post-op Assessment:  Report given to PACU RN and Post -op Vital signs reviewed and stable  Post vital signs:  Reviewed and stable  Last Vitals:  Filed Vitals:   12/01/14 1640  BP: 141/88  Pulse: 60  Temp: 37.6 C  Resp: 16    Complications: No apparent anesthesia complications

## 2014-12-01 NOTE — Brief Op Note (Signed)
12/01/2014  7:47 PM  PATIENT:  Blake Powell  29 y.o. male  PRE-OPERATIVE DIAGNOSIS:  ureteral stone  POST-OPERATIVE DIAGNOSIS:  * No post-op diagnosis entered *  PROCEDURE:  Procedure(s): CYSTOSCOPY/ WITH LEFT URETERAL STENT (Left)  SURGEON:  Surgeon(s) and Role:    * Sebastian Ache, MD - Primary  PHYSICIAN ASSISTANT:   ASSISTANTS: none   ANESTHESIA:   general  EBL:     BLOOD ADMINISTERED:none  DRAINS: none   LOCAL MEDICATIONS USED:  NONE  SPECIMEN:  Source of Specimen:  left renal pelvis urine  DISPOSITION OF SPECIMEN:  gram stain and culture  COUNTS:  YES  TOURNIQUET:  * No tourniquets in log *  DICTATION: .Other Dictation: Dictation Number [id number]  PLAN OF CARE: Admit for overnight observation  PATIENT DISPOSITION:  PACU - hemodynamically stable.   Delay start of Pharmacological VTE agent (>24hrs) due to surgical blood loss or risk of bleeding: not applicable

## 2014-12-01 NOTE — H&P (Signed)
Blake Powell is an 29 y.o. male.    Chief Complaint: Pre-Op Left Ureteroscopy v. Stent Placement  HPI:   1 - Left Ureteral Stone, Refracotry Pain with nausea and emesis - POD 1 s/p left lithoripsy from 9mm UPJ stone. Had good fragmentation of stone by procedural fluoroscopy. Presented to Urol office today with refractory pain / nausea / emesis c/w fragment colic. Denies fevers.  Today Blake Powell is seen as direct admit from our office for left ureteroscopy and / or stent placement for refracotory colic from stone fragments.   Past Medical History  Diagnosis Date  . ITP (idiopathic thrombocytopenic purpura)   . IBS (irritable bowel syndrome)   . Renal stones     multiple in the past able to pass  . GERD (gastroesophageal reflux disease)   . Blood dyscrasia     Past Surgical History  Procedure Laterality Date  . Fracture surgery      right ankle 2006    History reviewed. No pertinent family history. Social History:  reports that he has been smoking.  He does not have any smokeless tobacco history on file. He reports that he drinks alcohol. His drug history is not on file.  Allergies:  Allergies  Allergen Reactions  . Sulfa Antibiotics Anaphylaxis    Medications Prior to Admission  Medication Sig Dispense Refill  . docusate sodium (COLACE) 100 MG capsule Take 100 mg by mouth daily as needed for mild constipation.    Marland Kitchen oxyCODONE-acetaminophen (PERCOCET/ROXICET) 5-325 MG per tablet Take 1-2 tablets by mouth every 4 (four) hours as needed for severe pain. May take 2 tablets PO q 6 hours for severe pain - Do not take with Tylenol as this tablet already contains tylenol 30 tablet 0  . ondansetron (ZOFRAN ODT) 4 MG disintegrating tablet Take 1 tablet (4 mg total) by mouth every 8 (eight) hours as needed for nausea or vomiting. (Patient not taking: Reported on 12/01/2014) 20 tablet 0  . senna-docusate (SENOKOT-S) 8.6-50 MG per tablet Take 1 tablet by mouth 2 (two) times daily. While  taking pain meds to prevent constipation (Patient not taking: Reported on 12/01/2014) 30 tablet 0    No results found for this or any previous visit (from the past 48 hour(s)). Dg Abd 1 View  11/30/2014   CLINICAL DATA:  Left renal calculus.  Preop for lithotripsy.  EXAM: ABDOMEN - 1 VIEW  COMPARISON:  11/25/2014 abdominal radiograph.  11/23/2014 insert CT.  FINDINGS: 8 mm calculus projecting over the left mid abdomen corresponds to the left renal pelvis stone on the prior CT, positioned more laterally/ proximally in the pelvis than on the prior radiograph. 2 mm right renal calculus is also again seen. No definite calculi are identified along the course of the ureters. No dilated loops of bowel are seen. No acute osseous abnormality is identified. Visualized lung bases are clear.  IMPRESSION: Bilateral renal calculi without significant interval change.   Electronically Signed   By: Sebastian Ache   On: 11/30/2014 07:46    Review of Systems  Constitutional: Positive for malaise/fatigue. Negative for fever and chills.  HENT: Negative.   Eyes: Negative.   Respiratory: Negative.   Cardiovascular: Negative.   Gastrointestinal: Positive for nausea, vomiting and abdominal pain.  Genitourinary: Positive for flank pain.  Musculoskeletal: Negative.   Skin: Negative.   Neurological: Negative.   Endo/Heme/Allergies: Negative.   Psychiatric/Behavioral: Negative.     Blood pressure 141/88, pulse 60, temperature 99.7 F (37.6 C), temperature source Oral,  resp. rate 16, height  (1.905 m), weight 120.294 kg (265 lb 3.2 oz), SpO2 99 %. Physical Exam  Constitutional: He appears well-developed.  Visible nausea  HENT:  Head: Normocephalic.  Eyes: Pupils are equal, round, and reactive to light.  Neck: Normal range of motion.  Cardiovascular: Normal rate.   Respiratory: Effort normal.  GI: Soft.  Genitourinary:  Moderate left CVAT. No ecchymoses  Musculoskeletal: Normal range of motion.  Neurological:  He is alert.  Skin: Skin is warm.  Psychiatric: His behavior is normal. Judgment and thought content normal.     Assessment/Plan  1 - Left Ureteral Stone, Refracotry Pain with nausea and emesis - proceed as planned with ureteroscopy and or stenting pending operative findings. Risks, benefits, alternatives outlied previously and reiterated.   Amen Dargis 12/01/2014, 5:38 PM

## 2014-12-01 NOTE — Anesthesia Preprocedure Evaluation (Addendum)
Anesthesia Evaluation  Patient identified by MRN, date of birth, ID band  Reviewed: Allergy & Precautions, NPO status , Patient's Chart, lab work & pertinent test results  Airway Mallampati: II  TM Distance: >3 FB Neck ROM: Full    Dental   Pulmonary Current Smoker,  breath sounds clear to auscultation        Cardiovascular negative cardio ROS  Rhythm:Regular Rate:Normal     Neuro/Psych    GI/Hepatic Neg liver ROS, GERD-  ,  Endo/Other    Renal/GU Renal disease     Musculoskeletal   Abdominal   Peds  Hematology   Anesthesia Other Findings   Reproductive/Obstetrics                           Anesthesia Physical Anesthesia Plan  ASA: III  Anesthesia Plan: General   Post-op Pain Management:    Induction: Intravenous  Airway Management Planned: LMA  Additional Equipment:   Intra-op Plan:   Post-operative Plan: Extubation in OR  Informed Consent: I have reviewed the patients History and Physical, chart, labs and discussed the procedure including the risks, benefits and alternatives for the proposed anesthesia with the patient or authorized representative who has indicated his/her understanding and acceptance.   Dental advisory given  Plan Discussed with: CRNA, Anesthesiologist and Surgeon  Anesthesia Plan Comments:        Anesthesia Quick Evaluation

## 2014-12-02 ENCOUNTER — Encounter (HOSPITAL_COMMUNITY): Payer: Self-pay | Admitting: Urology

## 2014-12-02 MED ORDER — CALCIUM CARBONATE ANTACID 500 MG PO CHEW
1.0000 | CHEWABLE_TABLET | ORAL | Status: DC | PRN
  Administered 2014-12-02: 400 mg via ORAL
  Filled 2014-12-02: qty 2

## 2014-12-02 NOTE — Discharge Instructions (Signed)
1 - You may have urinary urgency (bladder spasms) and bloody urine on / off with stent in place. This is normal.  2 - Call MD or go to ER for fever >102, severe pain / nausea / vomiting not relieved by medications, or acute change in medical status  3 - Please strain urine to retrieve stone fragments for future analysis

## 2014-12-02 NOTE — Op Note (Signed)
NAME:  Blake Powell, Blake Powell NO.:  [id number]  MEDICAL RECORD NO.:  [id number]  LOCATION:  1427                         FACILITY:  [facility]  PHYSICIAN:  Sebastian Ache, MD     DATE OF BIRTH:  26-Jun-1985  DATE OF PROCEDURE:  12/01/2014                              OPERATIVE REPORT   DIAGNOSIS:  Refractory left renal colic with nausea and vomiting as well as low-grade fevers following shockwave lithotripsy.  PROCEDURES: 1. Cystoscopy with left retrograde pyelogram interpretation. 2. Insertion of left ureteral stent, 5 x 26 polaris, no tether.  ESTIMATED BLOOD LOSS:  Nil.  COMPLICATIONS:  None.  SPECIMEN:  Left renal pelvis urine for Gram stain and culture.  FINDINGS: 1. Moderate left hydronephrosis with ureteral nephrosis.  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 2. Efflux of copious proteinaceous appearing urine following glidewire     continuity of the left kidney.  Sample of the left renal pelvis     urine sent for Gram stain and culture. 3. Excellent placement of the left ureteral stent, proximal in the     renal pelvis, distal in the urinary bladder.  INDICATIONS:  Blake Powell is a pleasant 29 year old young man with recent history of left-sided flank pain.  He was found on workup of this to have likely ball-valving UPJ stone and was scheduled for shockwave lithotripsy by one of my colleagues.  He underwent this procedure yesterday in an uncomplicated fashion with excellent fluoroscopic fragmentation of the stone.  He presented to our office today with significant nausea, vomiting, and refractory colic.  He was unable to keep down his oral pain medicines.  Options were discussed for management including outpatient therapy with per rectal versus IM medication trial versus admission with left stent placement for presumed likely colic from stone fragments, and he wishes to proceed with  the latter.  Informed consent was obtained and placed in medical record. Note, the patient also had a low-grade fever on the short-stay area. His preoperative infectious parameters were unremarkable.  PROCEDURE IN DETAIL:  The patient being Blake Powell verified, procedure being left ureteral stent placement was confirmed.  Procedure was carried out.  Time-out was performed.  Intravenous antibiotics were administered.  General anesthesia was introduced.  The patient was placed into a low lithotomy position and sterile field was created by prepping and draping the patient's penis, perineum, and proximal thighs using iodine x3.  Next, cystourethroscopy was performed using a 23- French rigid cystoscope with 30-degree offset lens.  Inspection of the anterior and posterior urethra was unremarkable.  Inspection of the urinary bladder revealed no diverticula, calcifications, papular lesions.  The left ureter orifice was cannulated using a 6-French end- hole catheter and left retrograde pyelogram was obtained.  Left retrograde pyelogram demonstrated a single left ureter, single system left kidney.  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  There was some moderate hydronephrosis above this. A 0.038 Sensor wire was advanced at the level of the upper pole and once wire  continuity established, noted efflux of copious proteinaceous appearing urine via the left ureteral orifice.  A sample of this fluid was set aside for Gram stain and culture, labeled left renal pelvis urine.  Next, a 5 x 26 Polaris type stent was placed over the Sensor working wire using cystoscopic and fluoroscopic guidance.  Good proximal and distal deployment were noted.  Efflux of proteinaceous-appearing urine was seen around into the distal end of the stent.  Bladder was emptied per cystoscope.  Procedure was then terminated.   The patient tolerated the procedure well.  There were no immediate periprocedural complications.  The patient was taken to the postanesthesia care unit in stable condition.          ______________________________ Sebastian Ache, MD     TM/MEDQ  D:  12/01/2014  T:  12/02/2014  Job:  [id number]

## 2014-12-02 NOTE — Discharge Summary (Signed)
Physician Discharge Summary  Patient ID: Blake Powell MRN: [id number] DOB/AGE: 1986-03-15 29 y.o.  Admit date: 12/01/2014 Discharge date: 12/02/2014  Admission Diagnoses: Refractory left ureteral colic after lithotripsy  Discharge Diagnoses:  Active Problems:   Left ureteral stone   Discharged Condition: good  Hospital Course:   1 - Refractory left ureteral colic after lithotripsy - s/p outpatient shockwave lithotripsy, uncomplicated 11/30/14. Presented to urol office 8/2 with refracotry pain, nausea + emesis and therefored underwent cysto, left retrograde, left ureteral stent placement on 8/2 and observed overnight. By 8/3, the day of discharge, he is ambulatory,pain controlled on PO meds, tollerating PO diet, afebrile, and felt to be adequate for discharge.   Consults: None  Significant Diagnostic Studies: labs: UCX 8/2 - pending at discharge  Treatments: surgery: cysto, left retrograde, left ureteral stent placement on 12/01/2014  Discharge Exam: Blood pressure 113/72, pulse 82, temperature 98 F (36.7 C), temperature source Oral, resp. rate 16, height  (1.905 m), weight 123.378 kg (272 lb), SpO2 95 %. General appearance: alert, cooperative, appears stated age and fiancee at bedside Eyes: negative Nose: Nares normal. Septum midline. Mucosa normal. No drainage or sinus tenderness. Throat: lips, mucosa, and tongue normal; teeth and gums normal Neck: supple, symmetrical, trachea midline Back: symmetric, no curvature. ROM normal. No CVA tenderness. Resp: non-labored on room air Cardio: Nl rate GI: soft, non-tender; bowel sounds normal; no masses,  no organomegaly Extremities: extremities normal, atraumatic, no cyanosis or edema Skin: Skin color, texture, turgor normal. No rashes or lesions Lymph nodes: Cervical, supraclavicular, and axillary nodes normal. Neurologic: Grossly normal  Disposition: 01-Home or Self Care     Medication List    TAKE these medications       docusate sodium 100 MG capsule  Commonly known as:  COLACE  Take 100 mg by mouth daily as needed for mild constipation.     ondansetron 4 MG disintegrating tablet  Commonly known as:  ZOFRAN ODT  Take 1 tablet (4 mg total) by mouth every 8 (eight) hours as needed for nausea or vomiting.     oxyCODONE-acetaminophen 5-325 MG per tablet  Commonly known as:  PERCOCET/ROXICET  Take 1-2 tablets by mouth every 4 (four) hours as needed for severe pain. May take 2 tablets PO q 6 hours for severe pain - Do not take with Tylenol as this tablet already contains tylenol     senna-docusate 8.6-50 MG per tablet  Commonly known as:  Senokot-S  Take 1 tablet by mouth 2 (two) times daily. While taking pain meds to prevent constipation           Follow-up Information    Follow up with [facility]   Why:  as previously scheduled with X-ray   Contact information:   [address]  [place] [address] [place] [address] [phone]       Signed: Sebastian Ache 12/02/2014, 6:23 AM

## 2014-12-03 LAB — URINE CULTURE: CULTURE: NO GROWTH

## 2014-12-03 NOTE — Anesthesia Postprocedure Evaluation (Signed)
  Anesthesia Post-op Note  Patient: Blake Powell  Procedure(s) Performed: Procedure(s): CYSTOSCOPY/ RETOGRADE/WITH LEFT URETERAL STENT (Left)  Patient Location: PACU  Anesthesia Type:General  Level of Consciousness: awake  Airway and Oxygen Therapy: Patient Spontanous Breathing  Post-op Pain: mild  Post-op Assessment: Post-op Vital signs reviewed              Post-op Vital Signs: Reviewed  Last Vitals:  Filed Vitals:   12/02/14 0541  BP: 113/72  Pulse: 82  Temp: 36.7 C  Resp: 16    Complications: No apparent anesthesia complications

## 2014-12-10 ENCOUNTER — Encounter (HOSPITAL_COMMUNITY): Payer: Self-pay | Admitting: Urology

## 2014-12-17 ENCOUNTER — Other Ambulatory Visit: Payer: Self-pay | Admitting: Urology

## 2014-12-24 ENCOUNTER — Encounter (HOSPITAL_BASED_OUTPATIENT_CLINIC_OR_DEPARTMENT_OTHER): Payer: Self-pay | Admitting: *Deleted

## 2014-12-24 NOTE — Progress Notes (Signed)
NPO AFTER MN. ARRIVE AT 0700. NEEDS ISTAT 8. MAY TAKE PAIN/ NAUSEA RX IF NEEDED AM DOS W/ SIPS OF WATER. 

## 2014-12-30 ENCOUNTER — Ambulatory Visit (HOSPITAL_BASED_OUTPATIENT_CLINIC_OR_DEPARTMENT_OTHER): Payer: MEDICAID | Admitting: Anesthesiology

## 2014-12-30 ENCOUNTER — Ambulatory Visit (HOSPITAL_BASED_OUTPATIENT_CLINIC_OR_DEPARTMENT_OTHER)
Admission: RE | Admit: 2014-12-30 | Discharge: 2014-12-30 | Disposition: A | Discharge status: Home / Self Care | Payer: Self-pay | Source: Ambulatory Visit | Attending: Urology | Admitting: Urology

## 2014-12-30 ENCOUNTER — Encounter (HOSPITAL_BASED_OUTPATIENT_CLINIC_OR_DEPARTMENT_OTHER): Payer: Self-pay

## 2014-12-30 ENCOUNTER — Encounter (HOSPITAL_BASED_OUTPATIENT_CLINIC_OR_DEPARTMENT_OTHER)
Admission: RE | Disposition: A | Discharge status: Home / Self Care | Payer: Self-pay | Source: Ambulatory Visit | Attending: Urology

## 2014-12-30 ENCOUNTER — Ambulatory Visit (HOSPITAL_BASED_OUTPATIENT_CLINIC_OR_DEPARTMENT_OTHER): Payer: Self-pay | Admitting: Anesthesiology

## 2014-12-30 DIAGNOSIS — N135 Crossing vessel and stricture of ureter without hydronephrosis: Secondary | ICD-10-CM | POA: Insufficient documentation

## 2014-12-30 DIAGNOSIS — K589 Irritable bowel syndrome without diarrhea: Secondary | ICD-10-CM | POA: Insufficient documentation

## 2014-12-30 DIAGNOSIS — F17210 Nicotine dependence, cigarettes, uncomplicated: Secondary | ICD-10-CM | POA: Insufficient documentation

## 2014-12-30 DIAGNOSIS — Z882 Allergy status to sulfonamides status: Secondary | ICD-10-CM | POA: Insufficient documentation

## 2014-12-30 DIAGNOSIS — D693 Immune thrombocytopenic purpura: Secondary | ICD-10-CM | POA: Insufficient documentation

## 2014-12-30 DIAGNOSIS — Z87442 Personal history of urinary calculi: Secondary | ICD-10-CM | POA: Insufficient documentation

## 2014-12-30 DIAGNOSIS — K219 Gastro-esophageal reflux disease without esophagitis: Secondary | ICD-10-CM | POA: Insufficient documentation

## 2014-12-30 DIAGNOSIS — N201 Calculus of ureter: Secondary | ICD-10-CM | POA: Insufficient documentation

## 2014-12-30 HISTORY — PX: CYSTOSCOPY WITH RETROGRADE PYELOGRAM, URETEROSCOPY AND STENT PLACEMENT: SHX5789

## 2014-12-30 HISTORY — DX: Calculus of ureter: N20.1

## 2014-12-30 HISTORY — DX: Personal history of urinary calculi: Z87.442

## 2014-12-30 HISTORY — DX: Urgency of urination: R39.15

## 2014-12-30 LAB — POCT I-STAT, CHEM 8
BUN: 20 mg/dL (ref 6–20)
CALCIUM ION: 1.16 mmol/L (ref 1.12–1.23)
CREATININE: 1.1 mg/dL (ref 0.61–1.24)
Chloride: 107 mmol/L (ref 101–111)
GLUCOSE: 99 mg/dL (ref 65–99)
HEMATOCRIT: 46 % (ref 39.0–52.0)
Hemoglobin: 15.6 g/dL (ref 13.0–17.0)
Potassium: 4.1 mmol/L (ref 3.5–5.1)
Sodium: 141 mmol/L (ref 135–145)
TCO2: 22 mmol/L (ref 0–100)

## 2014-12-30 SURGERY — CYSTOURETEROSCOPY, WITH RETROGRADE PYELOGRAM AND STENT INSERTION
Anesthesia: General | Site: Ureter | Laterality: Left

## 2014-12-30 MED ORDER — DEXTROSE 5 % IV SOLN
5.0000 mg/kg | INTRAVENOUS | Status: AC
  Administered 2014-12-30: 500 mg via INTRAVENOUS
  Filled 2014-12-30: qty 12.5

## 2014-12-30 MED ORDER — KETOROLAC TROMETHAMINE 30 MG/ML IJ SOLN
INTRAMUSCULAR | Status: DC | PRN
  Administered 2014-12-30: 30 mg via INTRAVENOUS

## 2014-12-30 MED ORDER — LACTATED RINGERS IV SOLN
INTRAVENOUS | Status: DC
  Administered 2014-12-30: 08:00:00 via INTRAVENOUS
  Filled 2014-12-30: qty 1000

## 2014-12-30 MED ORDER — OXYCODONE-ACETAMINOPHEN 5-325 MG PO TABS
1.0000 | ORAL_TABLET | ORAL | Status: DC | PRN
  Administered 2014-12-30: 1 via ORAL
  Filled 2014-12-30: qty 2

## 2014-12-30 MED ORDER — IOHEXOL 350 MG/ML SOLN
INTRAVENOUS | Status: DC | PRN
Start: 2014-12-30 — End: 2014-12-30
  Administered 2014-12-30: 9 mL

## 2014-12-30 MED ORDER — DEXAMETHASONE SODIUM PHOSPHATE 4 MG/ML IJ SOLN
INTRAMUSCULAR | Status: DC | PRN
  Administered 2014-12-30: 10 mg via INTRAVENOUS

## 2014-12-30 MED ORDER — SODIUM CHLORIDE 0.9 % IR SOLN
Status: DC | PRN
  Administered 2014-12-30: 4000 mL

## 2014-12-30 MED ORDER — OXYCODONE-ACETAMINOPHEN 5-325 MG PO TABS
ORAL_TABLET | ORAL | Status: AC
  Filled 2014-12-30: qty 1

## 2014-12-30 MED ORDER — HYDROMORPHONE HCL 1 MG/ML IJ SOLN
0.2500 mg | INTRAMUSCULAR | Status: DC | PRN
  Filled 2014-12-30: qty 1

## 2014-12-30 MED ORDER — MIDAZOLAM HCL 5 MG/5ML IJ SOLN
INTRAMUSCULAR | Status: DC | PRN
  Administered 2014-12-30: 2 mg via INTRAVENOUS

## 2014-12-30 MED ORDER — GENTAMICIN IN SALINE 1.6-0.9 MG/ML-% IV SOLN
80.0000 mg | INTRAVENOUS | Status: DC
  Filled 2014-12-30: qty 50

## 2014-12-30 MED ORDER — FENTANYL CITRATE (PF) 100 MCG/2ML IJ SOLN
INTRAMUSCULAR | Status: AC
  Filled 2014-12-30: qty 4

## 2014-12-30 MED ORDER — MIDAZOLAM HCL 2 MG/2ML IJ SOLN
INTRAMUSCULAR | Status: AC
  Filled 2014-12-30: qty 2

## 2014-12-30 MED ORDER — FENTANYL CITRATE (PF) 100 MCG/2ML IJ SOLN
INTRAMUSCULAR | Status: DC | PRN
  Administered 2014-12-30: 50 ug via INTRAVENOUS

## 2014-12-30 MED ORDER — ONDANSETRON HCL 4 MG/2ML IJ SOLN
INTRAMUSCULAR | Status: DC | PRN
  Administered 2014-12-30: 4 mg via INTRAVENOUS

## 2014-12-30 MED ORDER — PROPOFOL 10 MG/ML IV BOLUS
INTRAVENOUS | Status: DC | PRN
  Administered 2014-12-30: 250 mg via INTRAVENOUS
  Administered 2014-12-30: 50 mg via INTRAVENOUS

## 2014-12-30 MED ORDER — PROMETHAZINE HCL 25 MG/ML IJ SOLN
6.2500 mg | INTRAMUSCULAR | Status: DC | PRN
  Filled 2014-12-30: qty 1

## 2014-12-30 MED ORDER — CEPHALEXIN 500 MG PO CAPS: 500.0000 mg | ORAL_CAPSULE | Freq: Two times a day (BID) | ORAL | Status: AC

## 2014-12-30 MED ORDER — LIDOCAINE HCL (CARDIAC) 20 MG/ML IV SOLN
INTRAVENOUS | Status: DC | PRN
  Administered 2014-12-30: 100 mg via INTRAVENOUS

## 2014-12-30 SURGICAL SUPPLY — 44 items
BAG DRAIN URO-CYSTO SKYTR STRL (DRAIN) ×3 IMPLANT
BAG DRN UROCATH (DRAIN) ×2
BASKET LASER NITINOL 1.9FR (BASKET) ×3 IMPLANT
BASKET STNLS GEMINI 4WIRE 3FR (BASKET) IMPLANT
BASKET STONE 1.7 NGAGE (UROLOGICAL SUPPLIES) IMPLANT
BASKET ZERO TIP NITINOL 2.4FR (BASKET) IMPLANT
BSKT STON RTRVL 120 1.9FR (BASKET) ×2
BSKT STON RTRVL GEM 120X11 3FR (BASKET)
BSKT STON RTRVL ZERO TP 2.4FR (BASKET)
CANISTER SUCT LVC 12 LTR MEDI- (MISCELLANEOUS) IMPLANT
CATH INTERMIT  6FR 70CM (CATHETERS) ×3 IMPLANT
CATH URET 5FR 28IN CONE TIP (BALLOONS)
CATH URET 5FR 28IN OPEN ENDED (CATHETERS) IMPLANT
CATH URET 5FR 70CM CONE TIP (BALLOONS) IMPLANT
CLOTH BEACON ORANGE TIMEOUT ST (SAFETY) ×3 IMPLANT
ELECT REM PT RETURN 9FT ADLT (ELECTROSURGICAL)
ELECTRODE REM PT RTRN 9FT ADLT (ELECTROSURGICAL) IMPLANT
FIBER LASER FLEXIVA 365 (UROLOGICAL SUPPLIES) IMPLANT
FIBER LASER TRAC TIP (UROLOGICAL SUPPLIES) IMPLANT
GLOVE BIO SURGEON STRL SZ 6.5 (GLOVE) ×3 IMPLANT
GLOVE BIO SURGEON STRL SZ7.5 (GLOVE) ×3 IMPLANT
GLOVE BIOGEL PI IND STRL 6.5 (GLOVE) ×4 IMPLANT
GLOVE BIOGEL PI INDICATOR 6.5 (GLOVE) ×2
GOWN STRL REUS W/ TWL LRG LVL3 (GOWN DISPOSABLE) ×4 IMPLANT
GOWN STRL REUS W/ TWL XL LVL3 (GOWN DISPOSABLE) IMPLANT
GOWN STRL REUS W/TWL LRG LVL3 (GOWN DISPOSABLE) ×6
GOWN STRL REUS W/TWL XL LVL3 (GOWN DISPOSABLE)
GUIDEWIRE 0.038 PTFE COATED (WIRE) IMPLANT
GUIDEWIRE ANG ZIPWIRE 038X150 (WIRE) ×3 IMPLANT
GUIDEWIRE STR DUAL SENSOR (WIRE) ×3 IMPLANT
IV NS 1000ML (IV SOLUTION) ×3
IV NS 1000ML BAXH (IV SOLUTION) ×2 IMPLANT
IV NS IRRIG 3000ML ARTHROMATIC (IV SOLUTION) ×3 IMPLANT
KIT BALLIN UROMAX 15FX10 (LABEL) IMPLANT
KIT BALLN UROMAX 15FX4 (MISCELLANEOUS) IMPLANT
KIT BALLN UROMAX 26 75X4 (MISCELLANEOUS)
MANIFOLD NEPTUNE II (INSTRUMENTS) ×3 IMPLANT
PACK CYSTO (CUSTOM PROCEDURE TRAY) ×3 IMPLANT
SET HIGH PRES BAL DIL (LABEL)
SHEATH ACCESS URETERAL 38CM (SHEATH) ×3 IMPLANT
STENT POLARIS 6X26 (STENTS) ×3 IMPLANT
SYRINGE 10CC LL (SYRINGE) ×3 IMPLANT
SYRINGE IRR TOOMEY STRL 70CC (SYRINGE) IMPLANT
TUBE FEEDING 8FR 16IN STR KANG (MISCELLANEOUS) ×3 IMPLANT

## 2014-12-30 NOTE — H&P (Signed)
Blake Powell is an 29 y.o. male.    Chief Complaint: Pre-Op Left Ureteroscopic Stone Manipulation  HPI:   1 - Left Ureteral Stone - s/p left lithoripsy from 9mm UPJ 11/30/14 but with post-procedure steinstrasse and refracotry sympotms requiring ureteral stetn placement 12/01/14. Had some bacteruria and subjective fevers at time therefore ureteroscopy not performed. F/u UCX negative.  Today Blake Powell is seen to proceed with left ureteroscopic stone manipulatio with goal of stone free. NO interval fevers.   Past Medical History  Diagnosis Date  . ITP (idiopathic thrombocytopenic purpura)     dx   age 53---  monitored by PCP  . IBS (irritable bowel syndrome)   . GERD (gastroesophageal reflux disease)   . Blood dyscrasia   . Left ureteral stone   . History of kidney stones   . Urgency of urination     Past Surgical History  Procedure Laterality Date  . Cystoscopy/retrograde/ureteroscopy/stone extraction with basket Left 12/01/2014    Procedure: CYSTOSCOPY/ RETOGRADE/WITH LEFT URETERAL STENT;  Surgeon: Sebastian Ache, MD;  Location: [facility];  Service: Urology;  Laterality: Left;  . Orif right ankle fx  2006    NO HARDWARE  . Extracorporeal shock wave lithotripsy Left 11-30-2014    History reviewed. No pertinent family history. Social History:  reports that he has been smoking Cigarettes.  He has a 5.5 pack-year smoking history. He has never used smokeless tobacco. He reports that he drinks alcohol. He reports that he does not use illicit drugs.  Allergies:  Allergies  Allergen Reactions  . Sulfa Antibiotics Anaphylaxis    No prescriptions prior to admission    No results found for this or any previous visit (from the past 48 hour(s)). No results found.  Review of Systems  Constitutional: Negative for fever and chills.  HENT: Negative.   Eyes: Negative.   Respiratory: Negative.   Cardiovascular: Negative.   Gastrointestinal: Negative.   Genitourinary: Negative.    Musculoskeletal: Negative.   Skin: Negative.   Neurological: Negative.   Endo/Heme/Allergies: Negative.   Psychiatric/Behavioral: Negative.     Height  (1.905 m), weight 122.471 kg (270 lb). Physical Exam  Constitutional: He appears well-developed.  HENT:  Head: Normocephalic.  Eyes: Pupils are equal, round, and reactive to light.  Neck: Normal range of motion.  Cardiovascular: Normal rate.   Respiratory: Effort normal.  GI: Soft.  Genitourinary: Penis normal.  Musculoskeletal: Normal range of motion.  Neurological: He is alert.  Skin: Skin is warm.  Psychiatric: He has a normal mood and affect.     Assessment/Plan   1 - Left Ureteral Stone - We discussed ureteroscopic stone manipulation with basketing and laser-lithotripsy in detail.  We discussed risks including bleeding, infection, damage to kidney / ureter  bladder, rarely loss of kidney. We discussed anesthetic risks and rare but serious surgical complications including DVT, PE, MI, and mortality. We specifically addressed that in 5-10% of cases a staged approach is required with stenting followed by re-attempt ureteroscopy if anatomy unfavorable. The patient voiced understanding and wishes to proceed.   Bernis Schreur 12/30/2014, 6:30 AM

## 2014-12-30 NOTE — Brief Op Note (Signed)
12/30/2014  8:56 AM  PATIENT:  Blake Powell  29 y.o. male  PRE-OPERATIVE DIAGNOSIS:  LEFT URETERAL STONE  POST-OPERATIVE DIAGNOSIS:  LEFT URETERAL STONE  PROCEDURE:  Procedure(s): CYSTOSCOPY WITH RETROGRADE PYELOGRAM, URETEROSCOPY AND STENT REMOVAL, STONE BASKET EXTRACTION (Left)  SURGEON:  Surgeon(s) and Role:    * Sebastian Ache, MD - Primary  PHYSICIAN ASSISTANT:   ASSISTANTS: none   ANESTHESIA:   general  EBL:     BLOOD ADMINISTERED:none  DRAINS: none   LOCAL MEDICATIONS USED:  NONE  SPECIMEN:  Source of Specimen:  left ureteral stone fragments  DISPOSITION OF SPECIMEN:  [facility] for compositional analysis  COUNTS:  YES  TOURNIQUET:  * No tourniquets in log *  DICTATION: .Other Dictation: Dictation Number [id number]  PLAN OF CARE: Discharge to home after PACU  PATIENT DISPOSITION:  PACU - hemodynamically stable.   Delay start of Pharmacological VTE agent (>24hrs) due to surgical blood loss or risk of bleeding: yes

## 2014-12-30 NOTE — Anesthesia Procedure Notes (Signed)
Procedure Name: LMA Insertion Date/Time: 12/30/2014 8:24 AM Performed by: Tyrone Nine Pre-anesthesia Checklist: Patient identified, Timeout performed, Emergency Drugs available, Suction available and Patient being monitored Patient Re-evaluated:Patient Re-evaluated prior to inductionOxygen Delivery Method: Circle system utilized Preoxygenation: Pre-oxygenation with 100% oxygen Intubation Type: IV induction Ventilation: Mask ventilation without difficulty LMA: LMA inserted LMA Size: 5.0 Number of attempts: 1 Placement Confirmation: breath sounds checked- equal and bilateral and positive ETCO2 Tube secured with: Tape Dental Injury: Teeth and Oropharynx as per pre-operative assessment

## 2014-12-30 NOTE — Anesthesia Preprocedure Evaluation (Addendum)
Anesthesia Evaluation  Patient identified by MRN, date of birth, ID band Patient awake    Reviewed: Allergy & Precautions, NPO status , Patient's Chart, lab work & pertinent test results  Airway Mallampati: II  TM Distance: >3 FB Neck ROM: Full    Dental  (+) Teeth Intact, Dental Advisory Given   Pulmonary Current Smoker,    Pulmonary exam normal       Cardiovascular Exercise Tolerance: Good Normal cardiovascular examRhythm:Regular Rate:Normal     Neuro/Psych negative neurological ROS  negative psych ROS   GI/Hepatic negative GI ROS, Neg liver ROS, GERD-  Medicated and Controlled,  Endo/Other  negative endocrine ROS  Renal/GU negative Renal ROS     Musculoskeletal negative musculoskeletal ROS (+)   Abdominal Normal abdominal exam  (+)   Peds  Hematology negative hematology ROS (+)   Anesthesia Other Findings   Reproductive/Obstetrics negative OB ROS                         Anesthesia Physical Anesthesia Plan  ASA: II  Anesthesia Plan: General   Post-op Pain Management:    Induction: Intravenous  Airway Management Planned: LMA  Additional Equipment:   Intra-op Plan:   Post-operative Plan:   Informed Consent: I have reviewed the patients History and Physical, chart, labs and discussed the procedure including the risks, benefits and alternatives for the proposed anesthesia with the patient or authorized representative who has indicated his/her understanding and acceptance.   Dental advisory given  Plan Discussed with: CRNA and Anesthesiologist  Anesthesia Plan Comments:         Anesthesia Quick Evaluation

## 2014-12-30 NOTE — Transfer of Care (Signed)
Immediate Anesthesia Transfer of Care Note  Patient: Blake Powell  Procedure(s) Performed: Procedure(s): CYSTOSCOPY WITH RETROGRADE PYELOGRAM, URETEROSCOPY AND STENT REMOVAL, STONE BASKET EXTRACTION (Left)  Patient Location: PACU  Anesthesia Type:General  Level of Consciousness: awake, alert , oriented and patient cooperative  Airway & Oxygen Therapy: Patient Spontanous Breathing and Patient connected to face mask oxygen  Post-op Assessment: Report given to RN and Post -op Vital signs reviewed and stable  Post vital signs: Reviewed and stable  Last Vitals:  Filed Vitals:   12/30/14 0724  BP: 120/81  Pulse: 73  Temp: 36.4 C  Resp: 16    Complications: No apparent anesthesia complications

## 2014-12-30 NOTE — Discharge Instructions (Signed)
1 - You may have urinary urgency (bladder spasms) and bloody urine on / off with stent in place. This is normal.  2 - Call MD or go to ER for fever >102, severe pain / nausea / vomiting not relieved by medications, or acute change in medical status  3 - Remove tethered stent on Friday morning at home by pulling on string, then blue/white plastic tubing, and discarding. Feel free to call office if any problems with this. [facility] [phone] Post Ureteroscopy With or Without Stent Instructions  Definitions:  Ureter: The duct that transports urine from the kidney to the bladder. Stent:   A plastic hollow tube that is placed into the ureter, from the kidney to the                 bladder to prevent the ureter from swelling shut.  GENERAL INSTRUCTIONS:  Despite the fact that no skin incisions were used, the area around the ureter and bladder is raw and irritated. The stent is a foreign body which will further irritate the bladder wall. This irritation is manifested by increased frequency of urination, both day and night, and by an increase in the urge to urinate. In some, the urge to urinate is present almost always. Sometimes the urge is strong enough that you may not be able to stop yourself from urinating. The only real cure is to remove the stent and then give time for the bladder wall to heal which can't be done until the danger of the ureter swelling shut has passed, which varies.  You may see some blood in your urine while the stent is in place and a few days afterwards. Do not be alarmed, even if the urine was clear for a while. Get off your feet and drink lots of fluids until clearing occurs. If you start to pass clots or don't improve, call us.  DIET: You may return to your normal diet immediately. Because of the raw surface of your bladder, alcohol, spicy foods, acid type foods and drinks with caffeine may cause irritation or frequency and should be used in moderation.  To keep your urine flowing freely and to avoid constipation, drink plenty of fluids during the day ( 8-10 glasses ). Tip: Avoid cranberry juice because it is very acidic.  ACTIVITY: Your physical activity doesn't need to be restricted. However, if you are very active, you may see some blood in your urine. We suggest that you reduce your activity under these circumstances until the bleeding has stopped.  BOWELS: It is important to keep your bowels regular during the postoperative period. Straining with bowel movements can cause bleeding. A bowel movement every other day is reasonable. Use a mild laxative if needed, such as Milk of Magnesia 2-3 tablespoons, or 2 Dulcolax tablets. Call if you continue to have problems. If you have been taking narcotics for pain, before, during or after your surgery, you may be constipated. Take a laxative if necessary.   MEDICATION: You should resume your pre-surgery medications unless told not to. In addition you will often be given an antibiotic to prevent infection. These should be taken as prescribed until the bottles are finished unless you are having an unusual reaction to one of the drugs.  PROBLEMS YOU SHOULD REPORT TO [place]:  Fevers over 100.5 Fahrenheit.  Heavy bleeding, or clots ( See above notes about blood in urine ).  Inability to urinate.  Drug reactions ( hives, rash, nausea, vomiting, diarrhea ).  Severe burning or pain with urination that is not improving.  FOLLOW-UP: You will need a follow-up appointment to monitor your progress. Call for this appointment at the number listed above. Usually the first appointment will be about three to fourteen days after your surgery.      Post Anesthesia Home Care Instructions  Activity: Get plenty of rest for the remainder of the day. A responsible adult should stay with you for 24 hours following the procedure.  For the next 24 hours, DO NOT: -Drive a car -Paediatric nurse -Drink alcoholic  beverages -Take any medication unless instructed by your physician -Make any legal decisions or sign important papers.  Meals: Start with liquid foods such as gelatin or soup. Progress to regular foods as tolerated. Avoid greasy, spicy, heavy foods. If nausea and/or vomiting occur, drink only clear liquids until the nausea and/or vomiting subsides. Call your physician if vomiting continues.  Special Instructions/Symptoms: Your throat may feel dry or sore from the anesthesia or the breathing tube placed in your throat during surgery. If this causes discomfort, gargle with warm salt water. The discomfort should disappear within 24 hours.  If you had a scopolamine patch placed behind your ear for the management of post- operative nausea and/or vomiting:  1. The medication in the patch is effective for 72 hours, after which it should be removed.  Wrap patch in a tissue and discard in the trash. Wash hands thoroughly with soap and water. 2. You may remove the patch earlier than 72 hours if you experience unpleasant side effects which may include dry mouth, dizziness or visual disturbances. 3. Avoid touching the patch. Wash your hands with soap and water after contact with the patch.

## 2014-12-30 NOTE — Anesthesia Postprocedure Evaluation (Signed)
  Anesthesia Post-op Note  Patient: Blake Powell  Procedure(s) Performed: Procedure(s): CYSTOSCOPY WITH RETROGRADE PYELOGRAM, URETEROSCOPY AND STENT REMOVAL, STONE BASKET EXTRACTION (Left)  Patient Location: PACU  Anesthesia Type:General  Level of Consciousness: awake  Airway and Oxygen Therapy: Patient Spontanous Breathing  Post-op Pain: mild  Post-op Assessment: Post-op Vital signs reviewed              Post-op Vital Signs: Reviewed  Last Vitals:  Filed Vitals:   12/30/14 1041  BP: 128/83  Pulse: 60  Temp: 36.7 C  Resp: 16    Complications: No apparent anesthesia complications

## 2014-12-31 ENCOUNTER — Encounter (HOSPITAL_BASED_OUTPATIENT_CLINIC_OR_DEPARTMENT_OTHER): Payer: Self-pay | Admitting: Urology

## 2014-12-31 NOTE — Op Note (Signed)
NAME:  Blake Powell, Blake Powell NO.:  [id number]  MEDICAL RECORD NO.:  [id number]  LOCATION:                               FACILITY:  [facility]  PHYSICIAN:  Sebastian Ache, MD     DATE OF BIRTH:  03/16/1986  DATE OF PROCEDURE:  12/30/2014                              OPERATIVE REPORT  DIAGNOSES: 1. Left ureteral stone. 2. 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  PROCEDURE: 1. Cystoscopy with left retrograde pyelogram interpretation. 2. Left ureteroscopy with basketing of stones. 3. Exchange of left ureteral stent 6 x 26 Polaris with tether.  ESTIMATED BLOOD LOSS:  Nil.  COMPLICATIONS:  None.  SPECIMENS:  Left ureteral stone fragments for compositional analysis.  FINDINGS: 1. Mild relative narrowing of proximal ureter just distal to the UPJ     without focal stricture, likely represents site of prior stone impaction. 2. Multifocal small volume left ureteral stones, total volume     approximately 3 mm. 3. Complete resolution of all stone fragments greater than 1/3rd mm     following ureteroscopy and basket extraction.  INDICATION:  [name] is a very pleasant 29 year old young man who has a recent history of a left-sided renal colic from a 9 mm stone.  He underwent shockwave lithotripsy, which resulted in excellent radiographic fragmentation of the stone on November 30, 2014.  He unfortunately presented to the office the next day with refractory colic symptoms and KUB consistent with likely Steinstrasse.  Unfortunately, he had some bacteriuria and subjective fevers at that time, therefore he underwent renal decompression with left ureteral stent placement and observation in the hospital, and he did not declare any infectious parameters.  He has had followup urine culture that has been negative. He now presents for more definitive management for his likely residual stone fragments on the left and to ensure stone free status with ureteroscopy.  Informed consent was  obtained and placed in medical record.  PROCEDURE IN DETAIL:  The patient being [name] verified. Procedure being left ureteroscopy was confirmed.  Procedure was carried out.  Time-out was performed.  Intravenous antibiotics were administered.  General LMA anesthesia introduced.  The patient was placed into a low lithotomy position.  Sterile field was created by prepping and draping the patient's penis, perineum, and proximal thighs using iodine x3.  Next, cystourethroscopy was performed using a 23- French rigid cystoscope with 30-degree offset lens.  Distal end of the ureteral stent was seen in situ, grasped, and brought to the level of the urethral meatus through which a 0.038 zip wire was advanced at the level of the upper pole and set aside as a safety wire.  This was exchanged for a 6-French open-end catheter and left retrograde pyelogram was obtained.  Left retrograde pyelogram demonstrated single left ureter, single system left kidney.  There was some relative narrowing noted in the proximal ureter just distal to the UPJ without obvious filling defects or hydronephrosis.  A 0.038 zip wire was once again advanced to the upper pole and set aside as a safety wire.  Next, semi-rigid ureteroscopy was performed in the distal two-thirds of the left ureter alongside a separate Sensor working wire.  Several small approximately 1  mm or less stone fragments were seen in the ureter, these appeared to be amenable to simple basketing.  They were grasped at the proximal level of the urinary bladder and set aside.  The semi-rigid ureteroscope was then exchanged for the 12/14, 35 cm ureteral access sheath using continuous fluoroscopic guidance over the Sensor working wire to the level of the more proximal ureter, but below the area of prior relative narrowing. Next, flexible digital ureteroscopy was performed using a dual channel ureteroscope, which allowed inspection further of the  ureter.  As expected, there was an area of relative narrowing in the proximal ureter, there was some mild mucosal edema at this site, this was felt to likely represent prior site of stone impaction.  There was no focal stricture, this was not at the area of the UPJ and felt not to represent UPJ obstruction physiology.  Given the relatively narrowing in this location, decision was made to traverse this with the single channel ureteroscope, which was carefully performed over the Sensor working wire and this relative narrowing was easily navigated past from a short segment.  Inspection of the more proximal ureter revealed no mucosal abnormalities.  The kidney was inspected revealing all calices x3. There was several small stone fragments in the mid pole calyx, these were amenable to simple basketing.  Representative specimen was set aside for compositional analysis.  The entire length of the ureter was inspected with withdrawal of the ureteral access sheath under continuous ureteroscopic vision and no additional abnormalities were seen whatsoever.  Given the area of relative narrowing in the proximal ureter, it was felt that continued interval stenting would be warranted. As such, a new 6 x 26 Polaris-type stent was placed using radiographic guidance.  Good proximal and distal deployment were noted.  Tether was left in place and fashioned to the dorsum of the penis.  Procedure was then terminated.  The patient tolerated the procedure well.  There were no immediate periprocedural complications.  The patient was taken to postanesthesia care unit in stable condition.          ______________________________ Sebastian Ache, MD     TM/MEDQ  D:  12/30/2014  T:  12/30/2014  Job:  [id number]

## 2015-10-22 IMAGING — CT CT RENAL STONE PROTOCOL
2 of 4 series · 16 of 46 positions shown, 18 images · non-contrast
Comparison: Prior CT from 06/03/2007

CLINICAL DATA: Initial evaluation for acute left lower quadrant
pain, hematuria.

EXAM:
CT ABDOMEN AND PELVIS WITHOUT CONTRAST
TECHNIQUE: Multidetector CT imaging of the abdomen and pelvis was performed
following the standard protocol without IV contrast.

[Series 2: stone study 5.0 i30f 1 · axial · 0.89mm/px · z∈[+753,+1188]mm · 13 of 95 slices shown, 15 images]
[im 4/95  soft-tissue]
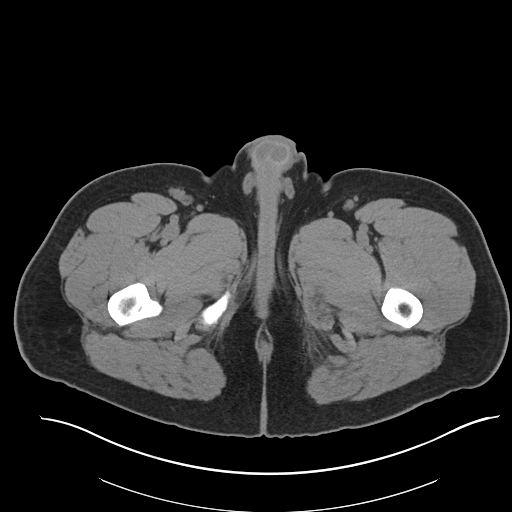
[im 4/95  bone]
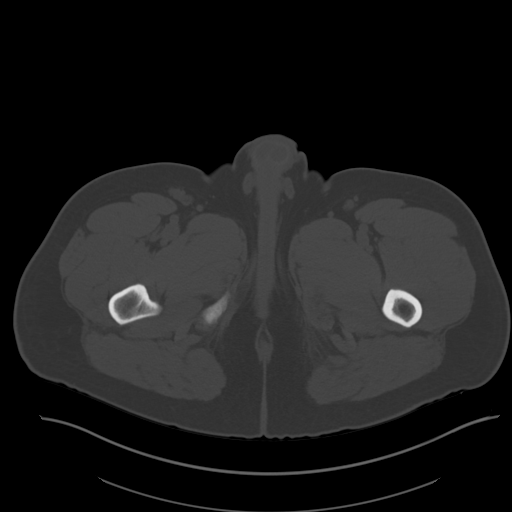
[im 11/95  soft-tissue]
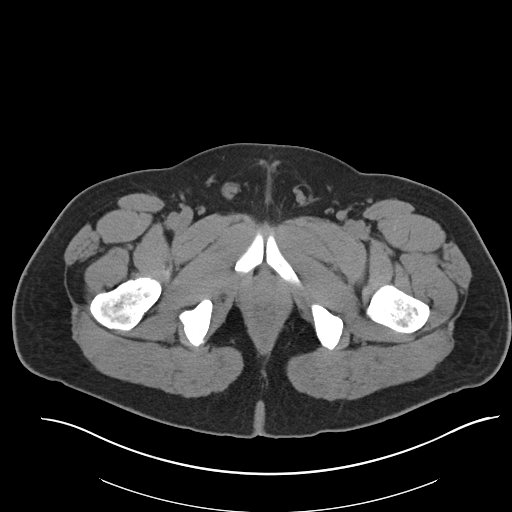
[im 19/95  soft-tissue]
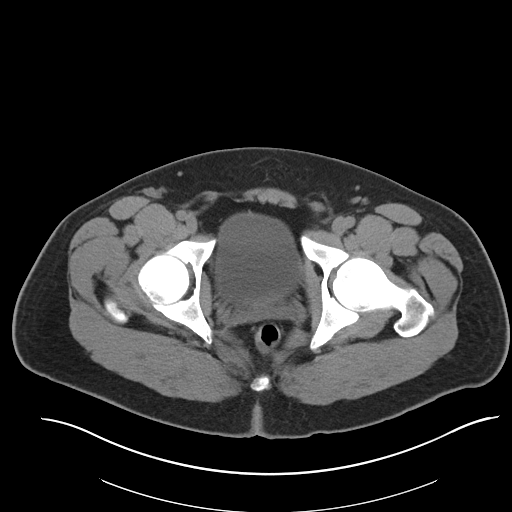
[im 26/95  soft-tissue]
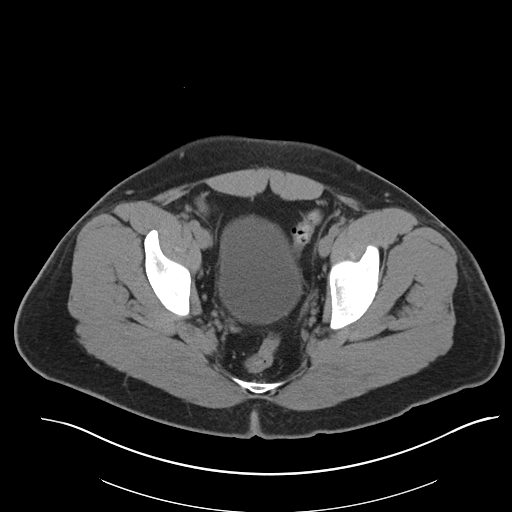
[im 33/95  soft-tissue]
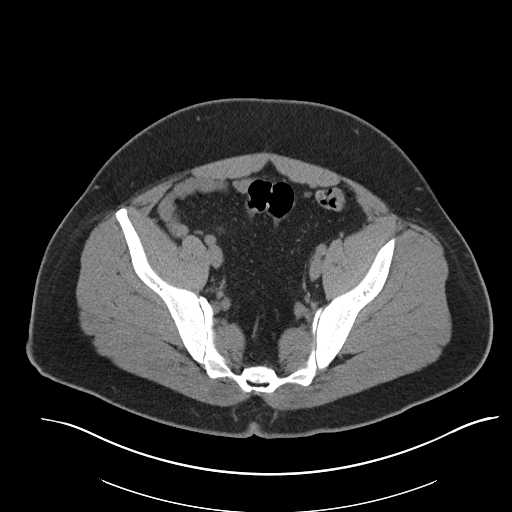
[im 40/95  soft-tissue]
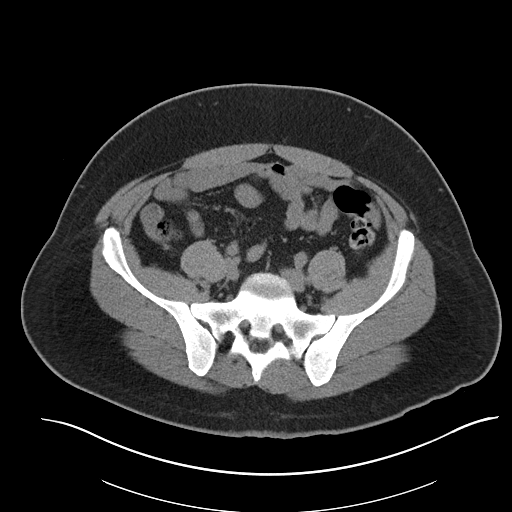
[im 48/95  soft-tissue]
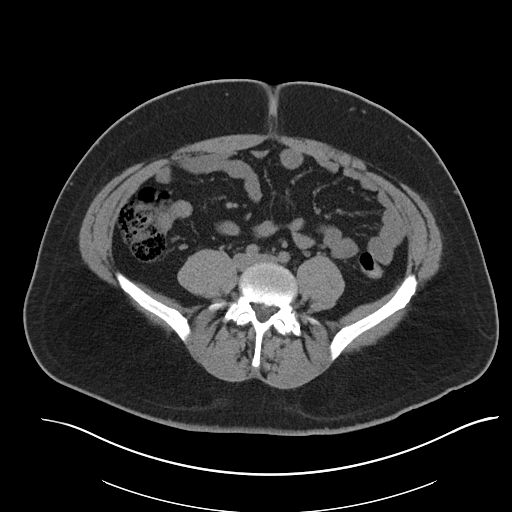
[im 55/95  soft-tissue]
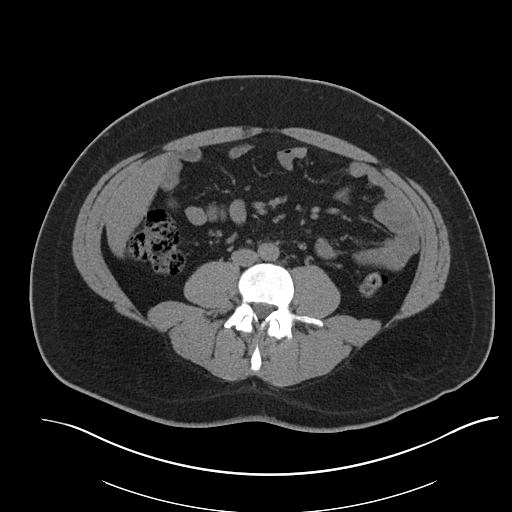
[im 62/95  soft-tissue]
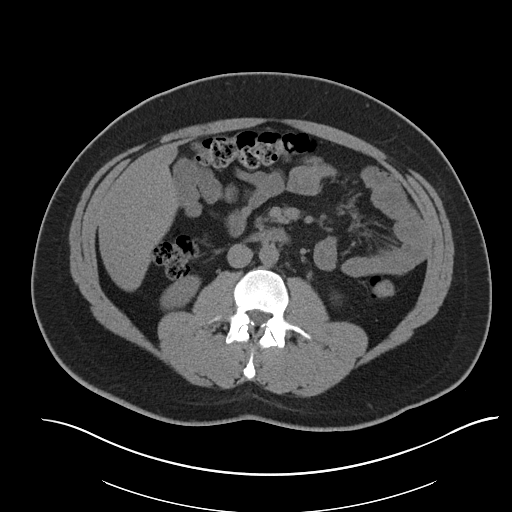
[im 62/95  bone]
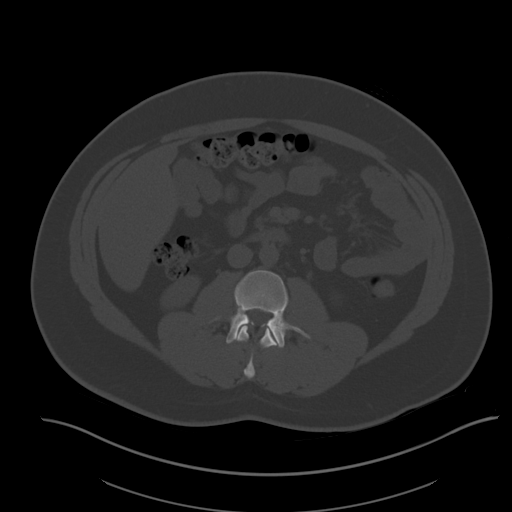
[im 69/95  soft-tissue]
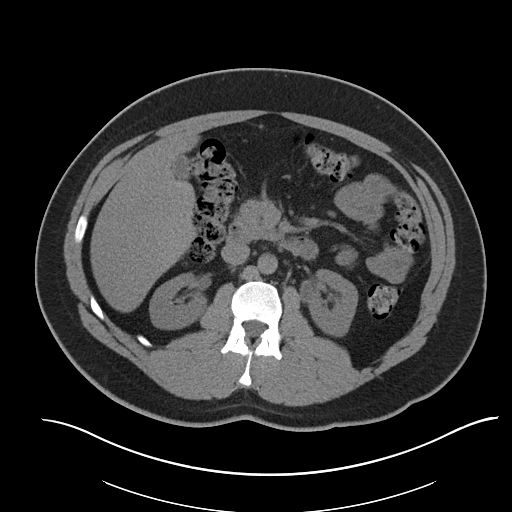
[im 76/95  soft-tissue]
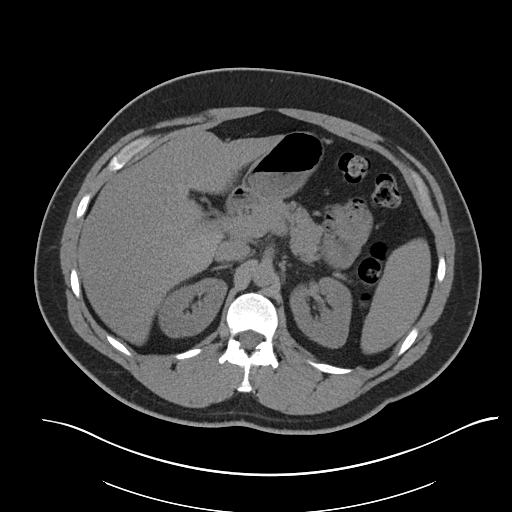
[im 84/95  soft-tissue]
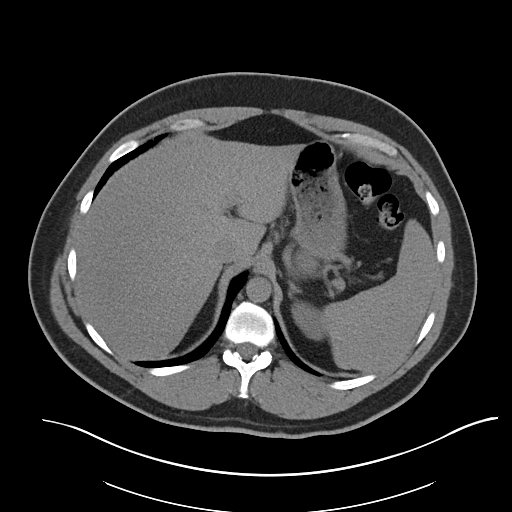
[im 91/95  soft-tissue]
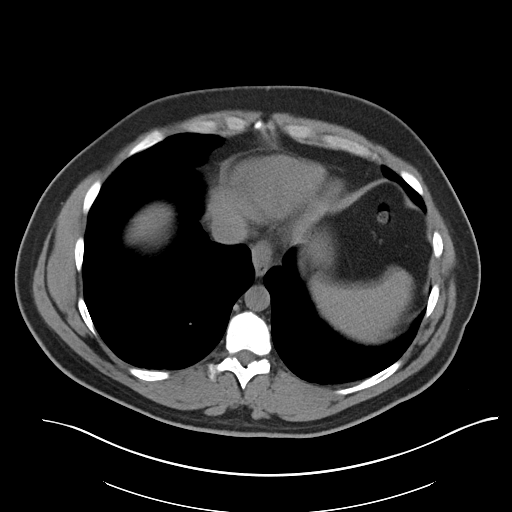

[Series 5: coronal soft tissue · coronal · 0.76mm/px · 3 of 101 slices shown]
[im 34/101  soft-tissue]
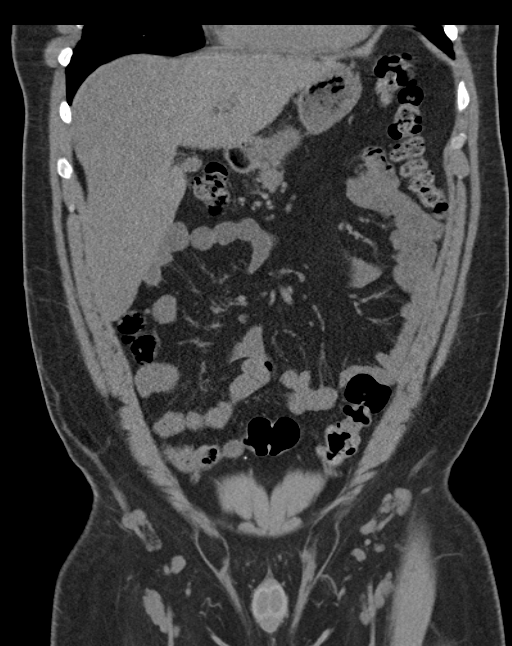
[im 45/101  soft-tissue]
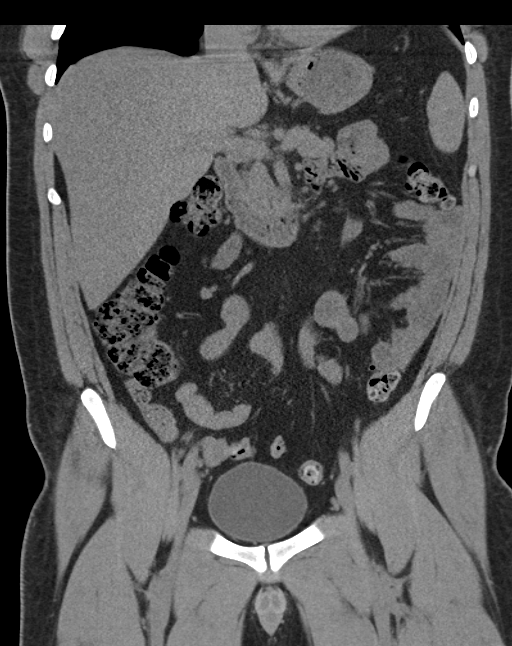
[im 56/101  soft-tissue]
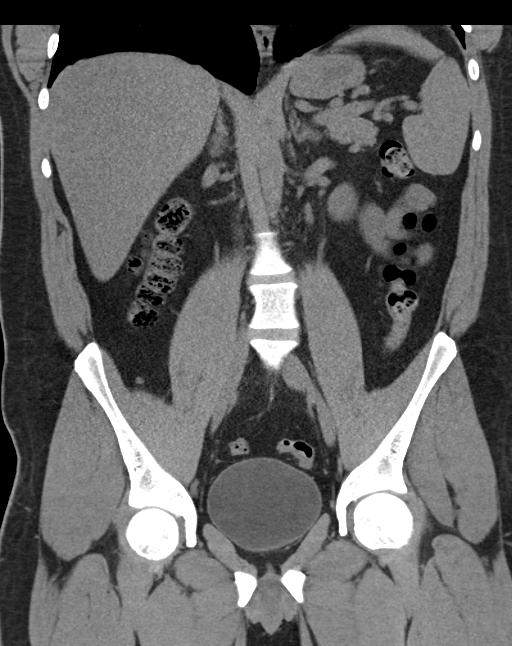

[16 of 46 positions shown; findings below may reference images not displayed]

FINDINGS: Visualized lung bases are clear. Liver demonstrates a normal
unenhanced appearance. Gallbladder within normal limits. No biliary
dilatation. Spleen, adrenal glands, and pancreas demonstrate a
normal unenhanced appearance.

Nonobstructive 5 mm stone present within the interpolar right
kidney. No radiopaque calculi seen along the course of the right
renal collecting system. There is no right-sided hydronephrosis or
hydroureter.

On the left, there is a in 9 mm stone lodged within the left renal
pelvis just proximal to the left UPJ (series 5, image 67). The left
renal pelvis is dilated with mild wall thickening and fuzzy margins,
suggesting associated inflammation. Mild left renal caliectasis
without frank hydronephrosis. Additional punctate nonobstructive
stones present within the upper and lower pole left kidney. No other
radiopaque calculi seen distally within the left ureter which is of
normal caliber.

Stomach within normal limits. No evidence for bowel obstruction. No
acute inflammatory changes seen about the bowels. Appendix is well
visualized in the right lower quadrant and is of normal caliber and
appearance without associated inflammatory changes to suggest acute
appendicitis.

Bladder within normal limits.  Prostate normal.

Bilateral fat containing inguinal hernias noted.

No free air or fluid. No pathologically enlarged intra-abdominal
pelvic lymph nodes.

No acute osseous abnormality. No worrisome lytic or blastic osseous
lesions.
IMPRESSION: 1. 9 mm stone within the distal left renal pelvis, just proximal to
the left UPJ. There is mild left pelvocaliectasis without frank
hydronephrosis. Mild wall thickening and haziness about the dilated
pelvis/UPJ suggestive of associated inflammation.
2. Additional bilateral nonobstructive nephrolithiasis as above.
3. No other acute intra-abdominal or pelvic process.

## 2017-07-10 DIAGNOSIS — F900 Attention-deficit hyperactivity disorder, predominantly inattentive type: Secondary | ICD-10-CM | POA: Diagnosis not present

## 2017-07-10 DIAGNOSIS — F909 Attention-deficit hyperactivity disorder, unspecified type: Secondary | ICD-10-CM | POA: Diagnosis not present

## 2017-07-10 DIAGNOSIS — Z720 Tobacco use: Secondary | ICD-10-CM | POA: Diagnosis not present

## 2017-09-10 DIAGNOSIS — Z13220 Encounter for screening for lipoid disorders: Secondary | ICD-10-CM | POA: Diagnosis not present

## 2017-09-10 DIAGNOSIS — F909 Attention-deficit hyperactivity disorder, unspecified type: Secondary | ICD-10-CM | POA: Diagnosis not present

## 2017-09-10 DIAGNOSIS — E781 Pure hyperglyceridemia: Secondary | ICD-10-CM | POA: Diagnosis not present

## 2017-09-10 DIAGNOSIS — Z716 Tobacco abuse counseling: Secondary | ICD-10-CM | POA: Diagnosis not present

## 2017-09-10 DIAGNOSIS — Z720 Tobacco use: Secondary | ICD-10-CM | POA: Diagnosis not present

## 2017-09-10 DIAGNOSIS — N529 Male erectile dysfunction, unspecified: Secondary | ICD-10-CM | POA: Diagnosis not present

## 2017-11-28 DIAGNOSIS — M25561 Pain in right knee: Secondary | ICD-10-CM | POA: Diagnosis not present

## 2017-11-28 DIAGNOSIS — M25562 Pain in left knee: Secondary | ICD-10-CM | POA: Diagnosis not present

## 2017-12-12 DIAGNOSIS — F909 Attention-deficit hyperactivity disorder, unspecified type: Secondary | ICD-10-CM | POA: Diagnosis not present

## 2018-01-09 DIAGNOSIS — Z3189 Encounter for other procreative management: Secondary | ICD-10-CM | POA: Diagnosis not present

## 2018-01-25 DIAGNOSIS — Z3141 Encounter for fertility testing: Secondary | ICD-10-CM | POA: Diagnosis not present

## 2018-03-14 DIAGNOSIS — R868 Other abnormal findings in specimens from male genital organs: Secondary | ICD-10-CM | POA: Diagnosis not present

## 2018-03-14 DIAGNOSIS — F17210 Nicotine dependence, cigarettes, uncomplicated: Secondary | ICD-10-CM | POA: Diagnosis not present

## 2018-03-14 DIAGNOSIS — E6609 Other obesity due to excess calories: Secondary | ICD-10-CM | POA: Diagnosis not present

## 2018-03-14 DIAGNOSIS — Z6837 Body mass index (BMI) 37.0-37.9, adult: Secondary | ICD-10-CM | POA: Diagnosis not present

## 2018-05-22 DIAGNOSIS — Z3189 Encounter for other procreative management: Secondary | ICD-10-CM | POA: Diagnosis not present

## 2018-06-21 DIAGNOSIS — Z3189 Encounter for other procreative management: Secondary | ICD-10-CM | POA: Diagnosis not present

## 2018-11-15 DIAGNOSIS — Z882 Allergy status to sulfonamides status: Secondary | ICD-10-CM | POA: Diagnosis not present

## 2018-11-15 DIAGNOSIS — R1031 Right lower quadrant pain: Secondary | ICD-10-CM | POA: Diagnosis not present

## 2018-11-15 DIAGNOSIS — N132 Hydronephrosis with renal and ureteral calculous obstruction: Secondary | ICD-10-CM | POA: Diagnosis not present

## 2018-11-15 DIAGNOSIS — Z87442 Personal history of urinary calculi: Secondary | ICD-10-CM | POA: Diagnosis not present

## 2018-11-16 DIAGNOSIS — Z87442 Personal history of urinary calculi: Secondary | ICD-10-CM | POA: Diagnosis not present

## 2018-11-16 DIAGNOSIS — Z881 Allergy status to other antibiotic agents status: Secondary | ICD-10-CM | POA: Diagnosis not present

## 2018-11-16 DIAGNOSIS — N202 Calculus of kidney with calculus of ureter: Secondary | ICD-10-CM | POA: Diagnosis not present

## 2019-01-03 DIAGNOSIS — Z3189 Encounter for other procreative management: Secondary | ICD-10-CM | POA: Diagnosis not present

## 2019-02-04 DIAGNOSIS — Z3189 Encounter for other procreative management: Secondary | ICD-10-CM | POA: Diagnosis not present

## 2019-03-13 DIAGNOSIS — N529 Male erectile dysfunction, unspecified: Secondary | ICD-10-CM | POA: Diagnosis not present

## 2019-03-13 DIAGNOSIS — H1013 Acute atopic conjunctivitis, bilateral: Secondary | ICD-10-CM | POA: Diagnosis not present

## 2019-03-13 DIAGNOSIS — R6882 Decreased libido: Secondary | ICD-10-CM | POA: Diagnosis not present

## 2019-03-13 DIAGNOSIS — Z23 Encounter for immunization: Secondary | ICD-10-CM | POA: Diagnosis not present

## 2019-04-04 DIAGNOSIS — Z20828 Contact with and (suspected) exposure to other viral communicable diseases: Secondary | ICD-10-CM | POA: Diagnosis not present

## 2019-10-27 DIAGNOSIS — Z119 Encounter for screening for infectious and parasitic diseases, unspecified: Secondary | ICD-10-CM | POA: Diagnosis not present

## 2020-05-12 DIAGNOSIS — U071 COVID-19: Secondary | ICD-10-CM | POA: Diagnosis not present
